# Patient Record
Sex: Female | Born: 1970 | Race: White | Hispanic: No | Marital: Married | Smoking: Never smoker
Health system: Southern US, Community
[De-identification: ages and names within clinical notes are randomized; demographics above are authoritative.]

## PROBLEM LIST (undated history)

## (undated) DIAGNOSIS — F419 Anxiety disorder, unspecified: Secondary | ICD-10-CM

## (undated) DIAGNOSIS — Z8489 Family history of other specified conditions: Secondary | ICD-10-CM

## (undated) DIAGNOSIS — R030 Elevated blood-pressure reading, without diagnosis of hypertension: Secondary | ICD-10-CM

## (undated) DIAGNOSIS — R42 Dizziness and giddiness: Secondary | ICD-10-CM

## (undated) DIAGNOSIS — K219 Gastro-esophageal reflux disease without esophagitis: Secondary | ICD-10-CM

## (undated) DIAGNOSIS — D259 Leiomyoma of uterus, unspecified: Secondary | ICD-10-CM

## (undated) HISTORY — PX: BREAST BIOPSY: SHX20

## (undated) HISTORY — PX: REDUCTION MAMMAPLASTY: SUR839

---

## 2011-03-31 ENCOUNTER — Other Ambulatory Visit: Payer: Self-pay | Admitting: Obstetrics and Gynecology

## 2011-03-31 DIAGNOSIS — Z803 Family history of malignant neoplasm of breast: Secondary | ICD-10-CM

## 2011-04-18 ENCOUNTER — Other Ambulatory Visit: Payer: Self-pay

## 2011-06-23 ENCOUNTER — Encounter: Payer: Self-pay | Admitting: Internal Medicine

## 2011-06-23 ENCOUNTER — Ambulatory Visit (INDEPENDENT_AMBULATORY_CARE_PROVIDER_SITE_OTHER): Payer: PRIVATE HEALTH INSURANCE | Admitting: Internal Medicine

## 2011-06-23 VITALS — BP 108/66 | HR 70 | Temp 98.2°F | Ht 66.0 in | Wt 157.0 lb

## 2011-06-23 DIAGNOSIS — J40 Bronchitis, not specified as acute or chronic: Secondary | ICD-10-CM

## 2011-06-23 MED ORDER — LEVOFLOXACIN 750 MG PO TABS: 750.0000 mg | ORAL_TABLET | Freq: Every day | ORAL | Status: AC

## 2011-06-23 MED ORDER — BENZONATATE 200 MG PO CAPS: 200.0000 mg | ORAL_CAPSULE | Freq: Three times a day (TID) | ORAL | Status: AC | PRN

## 2011-06-23 MED ORDER — GUAIFENESIN-CODEINE 100-10 MG/5ML PO SYRP: 5.0000 mL | ORAL_SOLUTION | Freq: Two times a day (BID) | ORAL | Status: AC | PRN

## 2011-06-23 NOTE — Patient Instructions (Signed)
[email or web address]  

## 2011-06-23 NOTE — Progress Notes (Signed)
Subjective:    Patient ID: Jennifer Wiley, female    DOB: 1970/12/03, 40 y.o.   MRN: [id number]  HPI 40 year old female presents to establish care. Her primary concern today is approximately a one-month history of cough. She notes that she was sick in October with cough and congestion. She attributed this to viral infection. The cough never completely resolved. Over the last week she has had some episodes of fever, chills, and cough with a metallic taste in her mouth which she thought might be blood. The cough has been nonproductive. She has also had some tightness across her chest. She reports that today she is feeling somewhat better. She has been using over-the-counter cough and cold preparations with minimal improvement in her symptoms.   Review of Systems  Constitutional: Positive for fever and chills. Negative for appetite change, fatigue and unexpected weight change.  HENT: Negative for ear pain, congestion, sore throat, trouble swallowing, neck pain, voice change and sinus pressure.   Eyes: Negative for visual disturbance.  Respiratory: Positive for cough and chest tightness. Negative for shortness of breath, wheezing and stridor.   Cardiovascular: Negative for chest pain, palpitations and leg swelling.  Gastrointestinal: Negative for nausea, vomiting, abdominal pain, diarrhea, constipation, blood in stool, abdominal distention and anal bleeding.  Genitourinary: Negative for dysuria and flank pain.  Musculoskeletal: Negative for myalgias, arthralgias and gait problem.  Skin: Negative for color change and rash.  Neurological: Negative for dizziness and headaches.  Hematological: Negative for adenopathy. Does not bruise/bleed easily.  Psychiatric/Behavioral: Negative for suicidal ideas, sleep disturbance and dysphoric mood. The patient is not nervous/anxious.    BP 108/66  Pulse 70  Temp(Src) 98.2 F (36.8 C) (Oral)  Ht 5\' 6"  (1.676 m)  Wt 157 lb (71.215 kg)  BMI 25.34 kg/m2   SpO2 98%  LMP 06/05/2011     Objective:   Physical Exam  Constitutional: She is oriented to person, place, and time. She appears well-developed and well-nourished. No distress.  HENT:  Head: Normocephalic and atraumatic.  Right Ear: External ear normal.  Left Ear: External ear normal.  Nose: Nose normal.  Mouth/Throat: Oropharynx is clear and moist. No oropharyngeal exudate.  Eyes: Conjunctivae are normal. Pupils are equal, round, and reactive to light. Right eye exhibits no discharge. Left eye exhibits no discharge. No scleral icterus.  Neck: Normal range of motion. Neck supple. No tracheal deviation present. No thyromegaly present.  Cardiovascular: Normal rate, regular rhythm, normal heart sounds and intact distal pulses.  Exam reveals no gallop and no friction rub.   No murmur heard. Pulmonary/Chest: Effort normal. No accessory muscle usage. Not tachypneic. No respiratory distress. She has decreased breath sounds in the right lower field. She has no wheezes. She has rhonchi in the right lower field. She has no rales. She exhibits no tenderness.  Musculoskeletal: Normal range of motion. She exhibits no edema and no tenderness.  Lymphadenopathy:    She has no cervical adenopathy.  Neurological: She is alert and oriented to person, place, and time. No cranial nerve deficit. She exhibits normal muscle tone. Coordination normal.  Skin: Skin is warm and dry. No rash noted. She is not diaphoretic. No erythema. No pallor.  Psychiatric: She has a normal mood and affect. Her behavior is normal. Judgment and thought content normal.          Assessment & Plan:  1. Bronchitis - Given persistence of cough x1 month, will get CXR. Will start empiric treatment with Levaquin x 7 days. Will use  tessalon during the day for cough and codeine at night. Follow up if symptoms are not improving next 48 hr.  2. Health Maintenance - Pt gets PAP and breast exam with OB/GYN. Will request records including  yearly labs.

## 2011-06-24 ENCOUNTER — Ambulatory Visit (INDEPENDENT_AMBULATORY_CARE_PROVIDER_SITE_OTHER)
Admission: RE | Admit: 2011-06-24 | Discharge: 2011-06-24 | Disposition: A | Discharge status: Home / Self Care | Payer: PRIVATE HEALTH INSURANCE | Source: Ambulatory Visit | Attending: Internal Medicine | Admitting: Internal Medicine

## 2011-06-24 DIAGNOSIS — J40 Bronchitis, not specified as acute or chronic: Secondary | ICD-10-CM

## 2012-06-07 ENCOUNTER — Other Ambulatory Visit: Payer: Self-pay | Admitting: Obstetrics and Gynecology

## 2012-06-07 DIAGNOSIS — N63 Unspecified lump in unspecified breast: Secondary | ICD-10-CM

## 2012-06-15 ENCOUNTER — Other Ambulatory Visit: Payer: PRIVATE HEALTH INSURANCE

## 2012-06-22 ENCOUNTER — Ambulatory Visit
Admission: RE | Admit: 2012-06-22 | Discharge: 2012-06-22 | Disposition: A | Discharge status: Home / Self Care | Payer: PRIVATE HEALTH INSURANCE | Source: Ambulatory Visit | Attending: Obstetrics and Gynecology | Admitting: Obstetrics and Gynecology

## 2012-06-22 DIAGNOSIS — N63 Unspecified lump in unspecified breast: Secondary | ICD-10-CM

## 2012-08-18 ENCOUNTER — Other Ambulatory Visit: Payer: Self-pay

## 2013-05-09 ENCOUNTER — Other Ambulatory Visit: Payer: Self-pay

## 2013-07-22 ENCOUNTER — Other Ambulatory Visit: Payer: Self-pay | Admitting: Obstetrics and Gynecology

## 2013-07-22 DIAGNOSIS — R928 Other abnormal and inconclusive findings on diagnostic imaging of breast: Secondary | ICD-10-CM

## 2013-08-06 ENCOUNTER — Ambulatory Visit
Admission: RE | Admit: 2013-08-06 | Discharge: 2013-08-06 | Disposition: A | Discharge status: Home / Self Care | Payer: No Typology Code available for payment source | Source: Ambulatory Visit | Attending: Obstetrics and Gynecology | Admitting: Obstetrics and Gynecology

## 2013-08-06 DIAGNOSIS — R928 Other abnormal and inconclusive findings on diagnostic imaging of breast: Secondary | ICD-10-CM

## 2013-10-22 ENCOUNTER — Other Ambulatory Visit: Payer: Self-pay | Admitting: Obstetrics and Gynecology

## 2013-10-22 DIAGNOSIS — R922 Inconclusive mammogram: Secondary | ICD-10-CM

## 2013-11-11 ENCOUNTER — Ambulatory Visit
Admission: RE | Admit: 2013-11-11 | Discharge: 2013-11-11 | Disposition: A | Discharge status: Home / Self Care | Payer: No Typology Code available for payment source | Source: Ambulatory Visit | Attending: Obstetrics and Gynecology | Admitting: Obstetrics and Gynecology

## 2013-11-11 DIAGNOSIS — R922 Inconclusive mammogram: Secondary | ICD-10-CM

## 2013-11-11 MED ORDER — GADOBENATE DIMEGLUMINE 529 MG/ML IV SOLN
14.0000 mL | Freq: Once | INTRAVENOUS | Status: AC | PRN
  Administered 2013-11-11: 14 mL via INTRAVENOUS

## 2014-06-23 ENCOUNTER — Ambulatory Visit (INDEPENDENT_AMBULATORY_CARE_PROVIDER_SITE_OTHER): Payer: No Typology Code available for payment source | Admitting: Internal Medicine

## 2014-06-23 ENCOUNTER — Encounter: Payer: Self-pay | Admitting: Internal Medicine

## 2014-06-23 VITALS — BP 103/68 | HR 96 | Temp 98.0°F | Ht 66.0 in | Wt 168.5 lb

## 2014-06-23 DIAGNOSIS — R5383 Other fatigue: Secondary | ICD-10-CM | POA: Insufficient documentation

## 2014-06-23 DIAGNOSIS — R079 Chest pain, unspecified: Secondary | ICD-10-CM

## 2014-06-23 LAB — COMPREHENSIVE METABOLIC PANEL
ALBUMIN: 4.4 g/dL (ref 3.5–5.2)
ALK PHOS: 78 U/L (ref 39–117)
ALT: 19 U/L (ref 0–35)
AST: 24 U/L (ref 0–37)
BILIRUBIN TOTAL: 0.5 mg/dL (ref 0.2–1.2)
BUN: 17 mg/dL (ref 6–23)
CO2: 28 mEq/L (ref 19–32)
Calcium: 9.2 mg/dL (ref 8.4–10.5)
Chloride: 102 mEq/L (ref 96–112)
Creatinine, Ser: 0.7 mg/dL (ref 0.4–1.2)
GFR: 96.25 mL/min (ref >60.00)
Glucose, Bld: 119 mg/dL — ABNORMAL HIGH (ref 70–99)
POTASSIUM: 3.7 meq/L (ref 3.5–5.1)
Sodium: 139 mEq/L (ref 135–145)
Total Protein: 7 g/dL (ref 6.0–8.3)

## 2014-06-23 LAB — LIPID PANEL
CHOL/HDL RATIO: 4
Cholesterol: 221 mg/dL — ABNORMAL HIGH (ref 0–200)
HDL: 62.8 mg/dL (ref >39.00)
LDL CALC: 125 mg/dL — AB (ref 0–99)
NonHDL: 158.2
TRIGLYCERIDES: 164 mg/dL — AB (ref 0.0–149.0)
VLDL: 32.8 mg/dL (ref 0.0–40.0)

## 2014-06-23 LAB — CBC WITH DIFFERENTIAL/PLATELET
BASOS ABS: 0 10*3/uL (ref 0.0–0.1)
Basophils Relative: 0.4 % (ref 0.0–3.0)
EOS PCT: 0.9 % (ref 0.0–5.0)
Eosinophils Absolute: 0.1 10*3/uL (ref 0.0–0.7)
HEMATOCRIT: 39.9 % (ref 36.0–46.0)
HEMOGLOBIN: 13.2 g/dL (ref 12.0–15.0)
LYMPHS ABS: 2.1 10*3/uL (ref 0.7–4.0)
LYMPHS PCT: 28.1 % (ref 12.0–46.0)
MCHC: 33 g/dL (ref 30.0–36.0)
MCV: 92.6 fl (ref 78.0–100.0)
MONOS PCT: 6.3 % (ref 3.0–12.0)
Monocytes Absolute: 0.5 10*3/uL (ref 0.1–1.0)
NEUTROS ABS: 4.7 10*3/uL (ref 1.4–7.7)
Neutrophils Relative %: 64.3 % (ref 43.0–77.0)
Platelets: 229 10*3/uL (ref 150.0–400.0)
RBC: 4.31 Mil/uL (ref 3.87–5.11)
RDW: 13.3 % (ref 11.5–15.5)
WBC: 7.3 10*3/uL (ref 4.0–10.5)

## 2014-06-23 LAB — C-REACTIVE PROTEIN: CRP: 0.6 mg/dL (ref 0.5–20.0)

## 2014-06-23 LAB — SEDIMENTATION RATE: SED RATE: 10 mm/h (ref 0–22)

## 2014-06-23 LAB — HEMOGLOBIN A1C: Hgb A1c MFr Bld: 5.8 % (ref 4.6–6.5)

## 2014-06-23 NOTE — Assessment & Plan Note (Addendum)
Intermittent symptoms of left sided chest pain at rest. Atypical for CAD. Exam is normal. EKG today normal. Labs today. Follow up in 1 week.

## 2014-06-23 NOTE — Assessment & Plan Note (Addendum)
6 month h/o generalized fatigue. Symptoms suggestive of thyroid dysfunction given cold intolerance, dry skin and hair. Exam normal. Will check CBC, ferritin, CMP, thyroid function. Follow up next week.

## 2014-06-23 NOTE — Progress Notes (Signed)
Subjective:    Patient ID: Jennifer Wiley, female    DOB: 07-01-1971, 43 y.o.   MRN: [id number]  HPI 43YO female presents for acute visit.  Last 6 months, notes weight gain and swelling in ankles and fingers and face, worse in morning. Feels tired. Also notes some depressed mood. Lives with husband and daughter, 10YO. Not taking any supplements. Eats a regular diet. Used to exercise daily, now 3 times per week. Feels more fatigued during exercise. Sleeping well. No snoring, apnea. Feels cold when others are comfortable. Notes dry skin and dry hair. Has regular periods, but now at shorter intervals. No heavy menstrual bleeding. No change in bowel habits.  Also occasionally has left sided chest pain, described as tightness, that radiates to arm. Sometimes wakes her from sleep. Typically occurs once per week. No pain with exertion. No dyspnea or diaphoresis with chest pain.   Multiple relatives with thyroid dysfunction.   Wt Readings from Last 3 Encounters:  06/23/14 168 lb 8 oz (76.431 kg)  06/23/11 157 lb (71.215 kg)      Past medical, surgical, family and social history per today's encounter.  Review of Systems  Constitutional: Positive for fatigue. Negative for fever, chills, appetite change and unexpected weight change.  Eyes: Negative for visual disturbance.  Respiratory: Negative for shortness of breath.   Cardiovascular: Positive for chest pain. Negative for leg swelling.  Gastrointestinal: Negative for nausea, vomiting, abdominal pain, diarrhea and constipation.  Endocrine: Positive for cold intolerance.  Genitourinary: Negative for menstrual problem.  Musculoskeletal: Negative for myalgias and arthralgias.  Skin: Negative for color change and rash.  Hematological: Negative for adenopathy. Does not bruise/bleed easily.  Psychiatric/Behavioral: Positive for dysphoric mood. Negative for suicidal ideas and sleep disturbance. The patient is not nervous/anxious.         Objective:    BP 103/68 mmHg  Pulse 96  Temp(Src) 98 F (36.7 C) (Oral)  Ht 5' 6"  (1.676 m)  Wt 168 lb 8 oz (76.431 kg)  BMI 27.21 kg/m2  SpO2 99%  LMP 06/11/2014 Physical Exam  Constitutional: She is oriented to person, place, and time. She appears well-developed and well-nourished. No distress.  HENT:  Head: Normocephalic and atraumatic.  Right Ear: External ear normal.  Left Ear: External ear normal.  Nose: Nose normal.  Mouth/Throat: Oropharynx is clear and moist. No oropharyngeal exudate.  Eyes: Conjunctivae are normal. Pupils are equal, round, and reactive to light. Right eye exhibits no discharge. Left eye exhibits no discharge. No scleral icterus.  Neck: Normal range of motion. Neck supple. No tracheal deviation present. No thyromegaly present.  Cardiovascular: Normal rate, regular rhythm, normal heart sounds and intact distal pulses.  Exam reveals no gallop and no friction rub.   No murmur heard. Pulmonary/Chest: Effort normal and breath sounds normal. No accessory muscle usage. No tachypnea. No respiratory distress. She has no decreased breath sounds. She has no wheezes. She has no rhonchi. She has no rales. She exhibits no tenderness.  Musculoskeletal: Normal range of motion. She exhibits no edema or tenderness.  Lymphadenopathy:    She has no cervical adenopathy.  Neurological: She is alert and oriented to person, place, and time. No cranial nerve deficit. She exhibits normal muscle tone. Coordination normal.  Skin: Skin is warm and dry. No rash noted. She is not diaphoretic. No erythema. No pallor.  Psychiatric: She has a normal mood and affect. Her behavior is normal. Judgment and thought content normal.  Assessment & Plan:   Problem List Items Addressed This Visit      Unprioritized   Other fatigue - Primary    6 month h/o generalized fatigue. Symptoms suggestive of thyroid dysfunction given cold intolerance, dry skin and hair. Exam normal.  Will check CBC, ferritin, CMP, thyroid function. Follow up next week.    Relevant Orders      CBC with Differential      Comprehensive metabolic panel      Lipid panel      Hemoglobin A1c      TSH      Microalbumin / creatinine urine ratio      Vit D  25 hydroxy (rtn osteoporosis monitoring)      T4, free      B12      Ferritin      ANA      Rheumatoid Factor      Sed Rate (ESR)      C-reactive protein   Pain in the chest    Intermittent symptoms of left sided chest pain at rest. Atypical for CAD. Exam is normal. EKG today normal. Labs today. Follow up in 1 week.    Relevant Orders      EKG 12-Lead (Completed)       Return in about 1 week (around 06/30/2014) for Recheck.

## 2014-06-23 NOTE — Progress Notes (Signed)
Pre visit review using our clinic review tool, if applicable. No additional management support is needed unless otherwise documented below in the visit note. 

## 2014-06-23 NOTE — Patient Instructions (Addendum)
We will check labs today and call you with results as soon as they are in.  Follow up in 1 week.

## 2014-06-24 LAB — RHEUMATOID FACTOR

## 2014-06-24 LAB — TSH: TSH: 1.05 u[IU]/mL (ref 0.35–4.50)

## 2014-06-24 LAB — T4, FREE: Free T4: 0.69 ng/dL (ref 0.60–1.60)

## 2014-06-24 LAB — VITAMIN D 25 HYDROXY (VIT D DEFICIENCY, FRACTURES): VITD: 46.38 ng/mL (ref 30.00–100.00)

## 2014-06-24 LAB — ANA: ANA: NEGATIVE

## 2014-06-24 LAB — VITAMIN B12: Vitamin B-12: 361 pg/mL (ref 211–911)

## 2014-06-24 LAB — FERRITIN: FERRITIN: 11.3 ng/mL (ref 10.0–291.0)

## 2014-06-30 ENCOUNTER — Encounter: Payer: Self-pay | Admitting: Internal Medicine

## 2014-06-30 ENCOUNTER — Ambulatory Visit (INDEPENDENT_AMBULATORY_CARE_PROVIDER_SITE_OTHER): Payer: No Typology Code available for payment source | Admitting: Internal Medicine

## 2014-06-30 VITALS — BP 106/68 | HR 86 | Temp 98.4°F | Ht 66.0 in | Wt 168.0 lb

## 2014-06-30 DIAGNOSIS — J302 Other seasonal allergic rhinitis: Secondary | ICD-10-CM | POA: Insufficient documentation

## 2014-06-30 DIAGNOSIS — F4323 Adjustment disorder with mixed anxiety and depressed mood: Secondary | ICD-10-CM

## 2014-06-30 DIAGNOSIS — R5383 Other fatigue: Secondary | ICD-10-CM

## 2014-06-30 DIAGNOSIS — F411 Generalized anxiety disorder: Secondary | ICD-10-CM | POA: Insufficient documentation

## 2014-06-30 MED ORDER — BUPROPION HCL ER (XL) 150 MG PO TB24: 150.0000 mg | ORAL_TABLET | Freq: Every day | ORAL | Status: DC

## 2014-06-30 NOTE — Progress Notes (Signed)
Subjective:    Patient ID: Derryl HarborCynthia Tatem, female    DOB: 07-29-1970, 43 y.o.   MRN: [id number]  HPI 43YO female presents for follow up.  Recent evaluation for fatigue including labs remarkable for low ferritin consistent with iron deficiency but not anemia.  Pt notes heavy menses, particularly first 1-2 days of menses. Also notes more severe pre-menstrual symptoms over last months. Notes family history of anxiety and depression. Feels that her own anxiety and symptoms of intermittent depressed mood have worsened over recent months. Has never been on anything for this. Trying to follow healthy diet and exercise. Wt Readings from Last 3 Encounters:  06/30/14 168 lb (76.204 kg)  06/23/14 168 lb 8 oz (76.431 kg)  06/23/11 157 lb (71.215 kg)      Past medical, surgical, family and social history per today's encounter.  Review of Systems  Constitutional: Positive for fatigue. Negative for fever, chills, appetite change and unexpected weight change.  Eyes: Negative for visual disturbance.  Respiratory: Negative for shortness of breath.   Cardiovascular: Negative for chest pain and leg swelling.  Gastrointestinal: Negative for nausea, vomiting, abdominal pain, diarrhea and constipation.  Genitourinary: Positive for menstrual problem.  Skin: Negative for color change and rash.  Hematological: Negative for adenopathy. Does not bruise/bleed easily.  Psychiatric/Behavioral: Positive for dysphoric mood. Negative for sleep disturbance. The patient is nervous/anxious.        Objective:    BP 106/68 mmHg  Pulse 86  Temp(Src) 98.4 F (36.9 C) (Oral)  Ht 5\' 6"  (1.676 m)  Wt 168 lb (76.204 kg)  BMI 27.13 kg/m2  SpO2 99%  LMP 06/11/2014 Physical Exam  Constitutional: She is oriented to person, place, and time. She appears well-developed and well-nourished. No distress.  HENT:  Head: Normocephalic and atraumatic.  Right Ear: External ear normal.  Left Ear: External ear normal.    Nose: Nose normal.  Mouth/Throat: Oropharynx is clear and moist. No oropharyngeal exudate.  Eyes: Conjunctivae are normal. Pupils are equal, round, and reactive to light. Right eye exhibits no discharge. Left eye exhibits no discharge. No scleral icterus.  Neck: Normal range of motion. Neck supple. No tracheal deviation present. No thyromegaly present.  Cardiovascular: Normal rate, regular rhythm, normal heart sounds and intact distal pulses.  Exam reveals no gallop and no friction rub.   No murmur heard. Pulmonary/Chest: Effort normal and breath sounds normal. No accessory muscle usage. No tachypnea. No respiratory distress. She has no decreased breath sounds. She has no wheezes. She has no rhonchi. She has no rales. She exhibits no tenderness.  Musculoskeletal: Normal range of motion. She exhibits no edema or tenderness.  Lymphadenopathy:    She has no cervical adenopathy.  Neurological: She is alert and oriented to person, place, and time. No cranial nerve deficit. She exhibits normal muscle tone. Coordination normal.  Skin: Skin is warm and dry. No rash noted. She is not diaphoretic. No erythema. No pallor.  Psychiatric: She has a normal mood and affect. Her speech is normal and behavior is normal. Judgment and thought content normal. She expresses no suicidal ideation.          Assessment & Plan:   Problem List Items Addressed This Visit      Unprioritized   Adjustment disorder with mixed anxiety and depressed mood - Primary    Symptoms consistent with mild anxiety/depression likely exacerbated by perimenopausal hormonal changes. Will start Wellbutrin. Discussed risks and benefits of this medication. Follow up in 2-4 weeks by email  and in 3 months.    Relevant Medications      buPROPion (WELLBUTRIN XL) 24 hr tablet   Other fatigue    Lab evaluation for fatigue remarkable only for iron deficiency likely from menstrual blood loss. She has started an iron supplement. Will recheck  CBC and ferritin in 3 months. Also discussed potential evaluation by GYN for Novasure.    Seasonal allergies    Symptoms of chronic seasonal allergies as well as possible allergy to dust, mold and pet dander. Will set up evaluation with ENT for allergy testing.    Relevant Orders      Ambulatory referral to ENT       Return in about 3 months (around 09/29/2014) for Recheck.

## 2014-06-30 NOTE — Assessment & Plan Note (Signed)
Lab evaluation for fatigue remarkable only for iron deficiency likely from menstrual blood loss. She has started an iron supplement. Will recheck CBC and ferritin in 3 months. Also discussed potential evaluation by GYN for Novasure.

## 2014-06-30 NOTE — Progress Notes (Signed)
Pre visit review using our clinic review tool, if applicable. No additional management support is needed unless otherwise documented below in the visit note. 

## 2014-06-30 NOTE — Assessment & Plan Note (Signed)
Symptoms consistent with mild anxiety/depression likely exacerbated by perimenopausal hormonal changes. Will start Wellbutrin. Discussed risks and benefits of this medication. Follow up in 2-4 weeks by email and in 3 months.

## 2014-06-30 NOTE — Assessment & Plan Note (Signed)
Symptoms of chronic seasonal allergies as well as possible allergy to dust, mold and pet dander. Will set up evaluation with ENT for allergy testing.

## 2014-06-30 NOTE — Patient Instructions (Addendum)
Start Wellbutrin 150mg  daily in the morning.  Follow up with labs in 3 months.

## 2014-07-15 ENCOUNTER — Other Ambulatory Visit: Payer: Self-pay | Admitting: Internal Medicine

## 2014-07-15 DIAGNOSIS — F4323 Adjustment disorder with mixed anxiety and depressed mood: Secondary | ICD-10-CM

## 2014-07-15 MED ORDER — BUPROPION HCL ER (XL) 300 MG PO TB24: 300.0000 mg | ORAL_TABLET | Freq: Every day | ORAL | Status: DC

## 2014-12-04 ENCOUNTER — Encounter: Payer: Self-pay | Admitting: Internal Medicine

## 2014-12-04 ENCOUNTER — Ambulatory Visit (INDEPENDENT_AMBULATORY_CARE_PROVIDER_SITE_OTHER): Payer: No Typology Code available for payment source | Admitting: Internal Medicine

## 2014-12-04 VITALS — BP 121/81 | HR 89 | Temp 98.1°F | Ht 66.0 in | Wt 161.1 lb

## 2014-12-04 DIAGNOSIS — F4323 Adjustment disorder with mixed anxiety and depressed mood: Secondary | ICD-10-CM

## 2014-12-04 MED ORDER — BUPROPION HCL ER (XL) 150 MG PO TB24: 150.0000 mg | ORAL_TABLET | Freq: Every day | ORAL | Status: DC

## 2014-12-04 NOTE — Assessment & Plan Note (Signed)
Symptoms improved with use of Wellbutrin. Will continue. Follow up 1 year and prn.

## 2014-12-04 NOTE — Progress Notes (Signed)
Pre visit review using our clinic review tool, if applicable. No additional management support is needed unless otherwise documented below in the visit note. 

## 2014-12-04 NOTE — Patient Instructions (Signed)
Continue current medications. 

## 2014-12-04 NOTE — Progress Notes (Signed)
   Subjective:    Patient ID: Jennifer Wiley, female    DOB: 1971-05-09, 44 y.o.   MRN: [id number]  HPI  44YO female presents for follow up.  Seen 06/2014 for fatigue and anxiety. Started on Wellbutrin. Notes improvement in energy level and anxiety with this medication. Briefly tried to increase to 300mg  daily but did not tolerate this and had palpitations. Feeling better on 150mg  dosing. No new concerns today.   Wt Readings from Last 3 Encounters:  12/04/14 161 lb 2 oz (73.086 kg)  06/30/14 168 lb (76.204 kg)  06/23/14 168 lb 8 oz (76.431 kg)    Past medical, surgical, family and social history per today's encounter.  Review of Systems  Constitutional: Negative for fever, chills, appetite change, fatigue and unexpected weight change.  Eyes: Negative for visual disturbance.  Respiratory: Negative for shortness of breath.   Cardiovascular: Negative for chest pain, palpitations and leg swelling.  Gastrointestinal: Negative for abdominal pain.  Skin: Negative for color change and rash.  Hematological: Negative for adenopathy. Does not bruise/bleed easily.  Psychiatric/Behavioral: Negative for suicidal ideas, sleep disturbance and dysphoric mood. The patient is not nervous/anxious.        Objective:    BP 121/81 mmHg  Pulse 89  Temp(Src) 98.1 F (36.7 C) (Oral)  Ht 5\' 6"  (1.676 m)  Wt 161 lb 2 oz (73.086 kg)  BMI 26.02 kg/m2  SpO2 99%  LMP 11/24/2014 Physical Exam  Constitutional: She is oriented to person, place, and time. She appears well-developed and well-nourished. No distress.  HENT:  Head: Normocephalic and atraumatic.  Right Ear: External ear normal.  Left Ear: External ear normal.  Nose: Nose normal.  Mouth/Throat: Oropharynx is clear and moist. No oropharyngeal exudate.  Eyes: Conjunctivae are normal. Pupils are equal, round, and reactive to light. Right eye exhibits no discharge. Left eye exhibits no discharge. No scleral icterus.  Neck: Normal range of  motion. Neck supple. No tracheal deviation present. No thyromegaly present.  Cardiovascular: Normal rate, regular rhythm, normal heart sounds and intact distal pulses.  Exam reveals no gallop and no friction rub.   No murmur heard. Pulmonary/Chest: Effort normal and breath sounds normal. No respiratory distress. She has no wheezes. She has no rales. She exhibits no tenderness.  Musculoskeletal: Normal range of motion. She exhibits no edema or tenderness.  Lymphadenopathy:    She has no cervical adenopathy.  Neurological: She is alert and oriented to person, place, and time. No cranial nerve deficit. She exhibits normal muscle tone. Coordination normal.  Skin: Skin is warm and dry. No rash noted. She is not diaphoretic. No erythema. No pallor.  Psychiatric: She has a normal mood and affect. Her behavior is normal. Judgment and thought content normal.          Assessment & Plan:   Problem List Items Addressed This Visit      Unprioritized   Adjustment disorder with mixed anxiety and depressed mood - Primary    Symptoms improved with use of Wellbutrin. Will continue. Follow up 1 year and prn.      Relevant Medications   buPROPion (WELLBUTRIN XL) 150 MG 24 hr tablet       Return in about 1 year (around 12/04/2015) for Recheck.

## 2014-12-16 ENCOUNTER — Other Ambulatory Visit: Payer: Self-pay | Admitting: Obstetrics and Gynecology

## 2014-12-16 DIAGNOSIS — R928 Other abnormal and inconclusive findings on diagnostic imaging of breast: Secondary | ICD-10-CM

## 2015-01-07 ENCOUNTER — Ambulatory Visit
Admission: RE | Admit: 2015-01-07 | Discharge: 2015-01-07 | Disposition: A | Discharge status: Home / Self Care | Payer: No Typology Code available for payment source | Source: Ambulatory Visit | Attending: Obstetrics and Gynecology | Admitting: Obstetrics and Gynecology

## 2015-01-07 DIAGNOSIS — R928 Other abnormal and inconclusive findings on diagnostic imaging of breast: Secondary | ICD-10-CM

## 2015-01-10 ENCOUNTER — Other Ambulatory Visit: Payer: Self-pay | Admitting: Internal Medicine

## 2015-01-12 NOTE — Telephone Encounter (Signed)
Last OV and refill 6.2.16.  Please advise refill

## 2015-04-02 ENCOUNTER — Other Ambulatory Visit: Payer: Self-pay | Admitting: Internal Medicine

## 2015-04-15 ENCOUNTER — Other Ambulatory Visit: Payer: Self-pay | Admitting: Internal Medicine

## 2015-06-10 ENCOUNTER — Other Ambulatory Visit: Payer: Self-pay | Admitting: Obstetrics and Gynecology

## 2015-06-10 DIAGNOSIS — N6489 Other specified disorders of breast: Secondary | ICD-10-CM

## 2015-06-16 ENCOUNTER — Ambulatory Visit
Admission: RE | Admit: 2015-06-16 | Discharge: 2015-06-16 | Disposition: A | Discharge status: Home / Self Care | Payer: No Typology Code available for payment source | Source: Ambulatory Visit | Attending: Obstetrics and Gynecology | Admitting: Obstetrics and Gynecology

## 2015-06-16 ENCOUNTER — Other Ambulatory Visit: Payer: Self-pay | Admitting: Obstetrics and Gynecology

## 2015-06-16 DIAGNOSIS — N6489 Other specified disorders of breast: Secondary | ICD-10-CM

## 2015-06-17 ENCOUNTER — Telehealth: Payer: Self-pay | Admitting: Internal Medicine

## 2015-06-17 NOTE — Telephone Encounter (Signed)
Patient is having a breast biopsy. Can you please call in Alprazolam 0.5mg  po x 1 taken 1hr before procedure, then repeat 0.5mg  po x 1 at time of procedure. Fine to dispense 4tabs in case they need to do another biopsy. Thanks!

## 2015-06-19 NOTE — Telephone Encounter (Signed)
Notified paitent via call.

## 2015-06-19 NOTE — Telephone Encounter (Signed)
[facility]

## 2015-06-23 ENCOUNTER — Ambulatory Visit
Admission: RE | Admit: 2015-06-23 | Discharge: 2015-06-23 | Disposition: A | Discharge status: Home / Self Care | Payer: No Typology Code available for payment source | Source: Ambulatory Visit | Attending: Obstetrics and Gynecology | Admitting: Obstetrics and Gynecology

## 2015-06-23 ENCOUNTER — Other Ambulatory Visit: Payer: Self-pay | Admitting: Obstetrics and Gynecology

## 2015-06-23 DIAGNOSIS — N6489 Other specified disorders of breast: Secondary | ICD-10-CM

## 2015-06-30 ENCOUNTER — Other Ambulatory Visit: Payer: No Typology Code available for payment source

## 2015-11-10 ENCOUNTER — Telehealth: Payer: Self-pay | Admitting: Internal Medicine

## 2015-11-10 ENCOUNTER — Telehealth: Payer: Self-pay | Admitting: *Deleted

## 2015-11-10 DIAGNOSIS — F4323 Adjustment disorder with mixed anxiety and depressed mood: Secondary | ICD-10-CM

## 2015-11-10 MED ORDER — BUPROPION HCL ER (XL) 150 MG PO TB24: 150.0000 mg | ORAL_TABLET | Freq: Every day | ORAL | Status: DC

## 2015-11-10 NOTE — Telephone Encounter (Signed)
Pt needs this sent to [facility] on [address]...not [facility].. Thanks

## 2015-11-10 NOTE — Telephone Encounter (Signed)
Patient requested a medication refill for bupropion  Pharmacy [facility] [address]

## 2015-11-10 NOTE — Telephone Encounter (Signed)
Resent to the correct pharmacy. thanks

## 2015-11-10 NOTE — Addendum Note (Signed)
Addended by: Acey LavOMAN, Dimitria Ketchum M on: 11/10/2015 04:11 PM   Modules accepted: Orders

## 2015-11-10 NOTE — Telephone Encounter (Signed)
Refilled. Thanks!

## 2016-01-11 ENCOUNTER — Ambulatory Visit: Payer: No Typology Code available for payment source | Admitting: Internal Medicine

## 2016-06-30 ENCOUNTER — Other Ambulatory Visit: Payer: Self-pay

## 2016-06-30 DIAGNOSIS — F4323 Adjustment disorder with mixed anxiety and depressed mood: Secondary | ICD-10-CM

## 2016-06-30 MED ORDER — BUPROPION HCL ER (XL) 150 MG PO TB24: 150.0000 mg | ORAL_TABLET | Freq: Every day | ORAL | 0 refills | Status: DC

## 2016-06-30 NOTE — Telephone Encounter (Signed)
No office visit scheduled  Last office visit 12/22/14 Overdue appointment

## 2016-08-12 ENCOUNTER — Other Ambulatory Visit: Payer: Self-pay | Admitting: Obstetrics and Gynecology

## 2016-08-12 DIAGNOSIS — N6489 Other specified disorders of breast: Secondary | ICD-10-CM

## 2016-08-12 DIAGNOSIS — N63 Unspecified lump in unspecified breast: Secondary | ICD-10-CM

## 2016-08-18 ENCOUNTER — Ambulatory Visit
Admission: RE | Admit: 2016-08-18 | Discharge: 2016-08-18 | Disposition: A | Discharge status: Home / Self Care | Payer: Managed Care, Other (non HMO) | Source: Ambulatory Visit | Attending: Obstetrics and Gynecology | Admitting: Obstetrics and Gynecology

## 2016-08-18 DIAGNOSIS — N6489 Other specified disorders of breast: Secondary | ICD-10-CM

## 2017-01-23 ENCOUNTER — Other Ambulatory Visit: Payer: Self-pay | Admitting: Family Medicine

## 2017-01-23 DIAGNOSIS — F4323 Adjustment disorder with mixed anxiety and depressed mood: Secondary | ICD-10-CM

## 2017-01-23 NOTE — Telephone Encounter (Signed)
lov 12/04/14 Dr Dan HumphreysWalker No office visit scheduled Last refilled 12/24/16

## 2017-10-16 ENCOUNTER — Other Ambulatory Visit: Payer: Self-pay | Admitting: Family Medicine

## 2017-10-16 DIAGNOSIS — R1011 Right upper quadrant pain: Secondary | ICD-10-CM

## 2017-10-31 ENCOUNTER — Ambulatory Visit
Admission: RE | Admit: 2017-10-31 | Discharge: 2017-10-31 | Disposition: A | Discharge status: Home / Self Care | Payer: 59 | Source: Ambulatory Visit | Attending: Family Medicine | Admitting: Family Medicine

## 2017-10-31 DIAGNOSIS — R1011 Right upper quadrant pain: Secondary | ICD-10-CM | POA: Diagnosis present

## 2018-07-04 DIAGNOSIS — Z8619 Personal history of other infectious and parasitic diseases: Secondary | ICD-10-CM

## 2018-07-04 HISTORY — DX: Personal history of other infectious and parasitic diseases: Z86.19

## 2018-12-05 ENCOUNTER — Other Ambulatory Visit: Payer: Self-pay | Admitting: Obstetrics and Gynecology

## 2018-12-05 DIAGNOSIS — N644 Mastodynia: Secondary | ICD-10-CM

## 2018-12-11 ENCOUNTER — Other Ambulatory Visit: Payer: 59

## 2018-12-25 ENCOUNTER — Other Ambulatory Visit: Payer: 59

## 2019-01-29 ENCOUNTER — Other Ambulatory Visit: Payer: Self-pay

## 2019-01-29 ENCOUNTER — Ambulatory Visit
Admission: RE | Admit: 2019-01-29 | Discharge: 2019-01-29 | Disposition: A | Discharge status: Home / Self Care | Payer: 59 | Source: Ambulatory Visit | Attending: Obstetrics and Gynecology | Admitting: Obstetrics and Gynecology

## 2019-01-29 ENCOUNTER — Ambulatory Visit: Payer: 59

## 2019-01-29 DIAGNOSIS — N644 Mastodynia: Secondary | ICD-10-CM

## 2019-04-03 ENCOUNTER — Telehealth: Payer: Self-pay | Admitting: Internal Medicine

## 2019-04-03 NOTE — Telephone Encounter (Signed)
Dr. Derrel Nip has excepted this patient. Lm to call office to set up a virtual appt.

## 2019-04-17 ENCOUNTER — Encounter: Payer: Self-pay | Admitting: Internal Medicine

## 2019-04-17 ENCOUNTER — Ambulatory Visit (INDEPENDENT_AMBULATORY_CARE_PROVIDER_SITE_OTHER): Payer: 59 | Admitting: Internal Medicine

## 2019-04-17 ENCOUNTER — Other Ambulatory Visit: Payer: Self-pay

## 2019-04-17 VITALS — BP 118/80 | Ht 67.0 in | Wt 175.0 lb

## 2019-04-17 DIAGNOSIS — K760 Fatty (change of) liver, not elsewhere classified: Secondary | ICD-10-CM

## 2019-04-17 DIAGNOSIS — B0229 Other postherpetic nervous system involvement: Secondary | ICD-10-CM

## 2019-04-17 DIAGNOSIS — N926 Irregular menstruation, unspecified: Secondary | ICD-10-CM | POA: Diagnosis not present

## 2019-04-17 DIAGNOSIS — F411 Generalized anxiety disorder: Secondary | ICD-10-CM

## 2019-04-17 DIAGNOSIS — R635 Abnormal weight gain: Secondary | ICD-10-CM | POA: Diagnosis not present

## 2019-04-17 MED ORDER — GABAPENTIN 100 MG PO CAPS: 100.0000 mg | ORAL_CAPSULE | Freq: Three times a day (TID) | ORAL | 3 refills | Status: DC | PRN

## 2019-04-17 MED ORDER — VENLAFAXINE HCL ER 37.5 MG PO CP24: 37.5000 mg | ORAL_CAPSULE | Freq: Every day | ORAL | 0 refills | Status: DC

## 2019-04-17 NOTE — Assessment & Plan Note (Signed)
Checking POCT pregnancy thyroid, FSH/LH/testosterone/DHEA to rule out premenopause vs PCOS

## 2019-04-17 NOTE — Assessment & Plan Note (Signed)
Screening or thyroid, diabetes,  Fatty liver suggested by abd ultrasound done last year.

## 2019-04-17 NOTE — Assessment & Plan Note (Signed)
Involving right breast and back.  trial of prn gabapentin

## 2019-04-17 NOTE — Progress Notes (Signed)
Virtual Visit via [email or web address]  This visit type was conducted due to national recommendations for restrictions regarding the COVID-19 pandemic (e.g. social distancing).  This format is felt to be most appropriate for this patient at this time.  All issues noted in this document were discussed and addressed.  No physical exam was performed (except for noted visual exam findings with Video Visits).   I connected with@ on 04/17/19 at  8:30 AM EDT by a video enabled telemedicine applicationand verified that I am speaking with the correct person using two identifiers. Location patient: home Location provider: work or home office Persons participating in the virtual visit: patient, provider  I discussed the limitations, risks, security and privacy concerns of performing an evaluation and management service by telephone and the availability of in person appointments. I also discussed with the patient that there may be a patient responsible charge related to this service. The patient expressed understanding and agreed to proceed.   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  Periods have become irregular has skipped one every 6 moths for the last year.    Last PAP unknown but [facility] Dr.  Orvan Seen   History of shingles involving right breast and back  In July.  Diagnostic mammogram done prior to diagnosis because pain preceded rash. Mammogram normal.  Maternal history of BRCA   Increased stressors (pandemic, election) aggravating symptoms.  Took wellbutrin in the past for anxiety  Helped a lot but started having nocturnal neurologic side effects so stopped it.      ROS: See pertinent positives and negatives per HPI.  No past medical history on file.  Past Surgical History:  Procedure Laterality Date  .  CESAREAN SECTION  2005    Family History  Problem Relation Age of Onset  . Breast cancer Mother   . Cancer Mother        breast  . Heart disease Father 42  . Heart attack Father   . Hyperlipidemia Father   . Depression Brother   . Lung cancer Maternal Aunt   . Lung cancer Maternal Grandfather   . Colon cancer Neg Hx     SOCIAL HX:  reports that she has never smoked. She has never used smokeless tobacco. She reports current alcohol use. She reports that she does not use drugs.   Current Outpatient Medications:  .  gabapentin (NEURONTIN) 100 MG capsule, Take 1 capsule (100 mg total) by mouth 3 (three) times daily as needed. For shingles pain, Disp: 90 capsule, Rfl: 3 .  venlafaxine XR (EFFEXOR XR) 37.5 MG 24 hr capsule, Take 1 capsule (37.5 mg total) by mouth daily with breakfast., Disp: 90 capsule, Rfl: 0  EXAM:  VITALS per patient if applicable:  GENERAL: alert, oriented, appears well and in no acute distress  HEENT: atraumatic, conjunttiva clear, no obvious abnormalities on inspection of external nose and ears  NECK: normal movements of the head and neck  LUNGS: on inspection no signs of respiratory distress, breathing rate appears normal, no obvious gross SOB, gasping or wheezing  CV: no obvious cyanosis  MS: moves all visible extremities without noticeable abnormality  PSYCH/NEURO: pleasant and cooperative, no obvious depression or anxiety, speech and thought processing grossly intact  ASSESSMENT AND PLAN:  Discussed the following assessment and plan:  Weight gain - Plan: Thyroid Panel With TSH, Hemoglobin A1c  Menstrual irregularity - Plan: Thyroid Panel With TSH, Follicle stimulating hormone, LH, Testos,Total,Free and SHBG (Female), DHEA-sulfate, CBC with Differential/Platelet, POCT urine pregnancy  Hepatic steatosis - Plan: Comprehensive metabolic panel, Lipid panel  Generalized anxiety disorder  Neuralgia, post-herpetic  Generalized anxiety  disorder Trial of Effexor discussed given concurrent menopausal symptoms.  Starting dose 37.5 mg qhs .  Will add buspar if needed   Follow up two weeks   Neuralgia, post-herpetic Involving right breast and back.  trial of prn gabapentin   Weight gain Screening or thyroid, diabetes,  Fatty liver suggested by abd ultrasound done last year.   Menstrual irregularity Checking POCT pregnancy thyroid, FSH/LH/testosterone/DHEA to rule out premenopause vs PCOS    I discussed the assessment and treatment plan with the patient. The patient was provided an opportunity to ask questions and all were answered. The patient agreed with the plan and demonstrated an understanding of the instructions.   The patient was advised to call back or seek an in-person evaluation if the symptoms worsen or if the condition fails to improve as anticipated.  I provided  45 minutes of non-face-to-face time during this encounter reviewing patient's current problems and post surgeries.  Providing counseling on the above mentioned problems , and coordination  of care .  Crecencio Mc, MD

## 2019-04-17 NOTE — Assessment & Plan Note (Signed)
Trial of Effexor discussed given concurrent menopausal symptoms.  Starting dose 37.5 mg qhs .  Will add buspar if needed   Follow up two weeks

## 2019-04-23 NOTE — Telephone Encounter (Signed)
error 

## 2021-11-01 ENCOUNTER — Other Ambulatory Visit: Payer: Self-pay | Admitting: Obstetrics and Gynecology

## 2021-11-01 DIAGNOSIS — R928 Other abnormal and inconclusive findings on diagnostic imaging of breast: Secondary | ICD-10-CM

## 2021-11-15 ENCOUNTER — Other Ambulatory Visit: Payer: 59

## 2021-11-25 ENCOUNTER — Other Ambulatory Visit: Payer: 59

## 2021-11-30 ENCOUNTER — Other Ambulatory Visit: Payer: Self-pay | Admitting: Obstetrics and Gynecology

## 2021-11-30 ENCOUNTER — Ambulatory Visit
Admission: RE | Admit: 2021-11-30 | Discharge: 2021-11-30 | Disposition: A | Discharge status: Home / Self Care | Payer: 59 | Source: Ambulatory Visit | Attending: Obstetrics and Gynecology | Admitting: Obstetrics and Gynecology

## 2021-11-30 DIAGNOSIS — R928 Other abnormal and inconclusive findings on diagnostic imaging of breast: Secondary | ICD-10-CM

## 2021-11-30 DIAGNOSIS — N6489 Other specified disorders of breast: Secondary | ICD-10-CM

## 2022-01-14 ENCOUNTER — Ambulatory Visit
Admission: RE | Admit: 2022-01-14 | Discharge: 2022-01-14 | Disposition: A | Discharge status: Home / Self Care | Payer: 59 | Source: Ambulatory Visit | Attending: Obstetrics and Gynecology | Admitting: Obstetrics and Gynecology

## 2022-01-14 DIAGNOSIS — N6489 Other specified disorders of breast: Secondary | ICD-10-CM

## 2022-10-06 ENCOUNTER — Encounter (HOSPITAL_BASED_OUTPATIENT_CLINIC_OR_DEPARTMENT_OTHER): Payer: Self-pay | Admitting: Obstetrics and Gynecology

## 2022-10-06 ENCOUNTER — Other Ambulatory Visit: Payer: Self-pay

## 2022-10-06 DIAGNOSIS — Z01818 Encounter for other preprocedural examination: Secondary | ICD-10-CM | POA: Diagnosis present

## 2022-10-06 NOTE — Progress Notes (Signed)
Your procedure is scheduled on Thursday, 10/13/2022.  Report to [facility] AT  5:30 AM.   Call this number if you have problems the morning of surgery  :[phone].   OUR ADDRESS IS [place].  WE ARE LOCATED IN [facility].  PLEASE BRING YOUR INSURANCE CARD AND PHOTO ID DAY OF SURGERY.  ONLY 2 PEOPLE ARE ALLOWED IN  WAITING  ROOM / CURRENTLY NO ONE UNDER AGE 50                                     REMEMBER:  DO NOT EAT FOOD, CANDY GUM OR MINTS  AFTER MIDNIGHT THE NIGHT BEFORE YOUR SURGERY . YOU MAY HAVE CLEAR LIQUIDS FROM MIDNIGHT THE NIGHT BEFORE YOUR SURGERY UNTIL 4:30 AM. NO CLEAR LIQUIDS AFTER  4:30 AM DAY OF SURGERY.  YOU MAY  BRUSH YOUR TEETH MORNING OF SURGERY AND RINSE YOUR MOUTH OUT, NO CHEWING GUM CANDY OR MINTS.     CLEAR LIQUID DIET    Allowed      Water                                                                   Coffee and tea, regular and decaf  (NO cream or milk products of any type, may sweeten)                         Carbonated beverages, regular and diet                                    Sports drinks like Gatorade _____________________________________________________________________     TAKE ONLY THESE MEDICATIONS MORNING OF SURGERY:  Wellbutrin, Gabapentin if needed    UP TO 4 VISITORS  MAY VISIT IN THE EXTENDED RECOVERY ROOM UNTIL 800 PM ONLY.  ONE  VISITOR AGE 84 AND OVER MAY SPEND THE NIGHT AND MUST BE IN EXTENDED RECOVERY ROOM NO LATER THAN 800 PM . YOUR DISCHARGE TIME AFTER YOU SPEND THE NIGHT IS 900 AM THE MORNING AFTER YOUR SURGERY.  YOU MAY PACK A SMALL OVERNIGHT BAG WITH TOILETRIES FOR YOUR OVERNIGHT STAY IF YOU WISH.  YOUR PRESCRIPTION MEDICATIONS WILL BE PROVIDED DURING [place].                                      DO NOT WEAR JEWERLY/  METAL/  PIERCINGS (INCLUDING NO PLASTIC PIERCINGS) DO NOT WEAR LOTIONS, POWDERS, PERFUMES OR NAIL POLISH ON YOUR FINGERNAILS. TOENAIL  POLISH IS OK TO WEAR. DO NOT SHAVE FOR 48 HOURS PRIOR TO DAY OF SURGERY.  CONTACTS, GLASSES, OR DENTURES MAY NOT BE WORN TO SURGERY.  REMEMBER: NO SMOKING, VAPING ,  DRUGS OR ALCOHOL FOR 24 HOURS BEFORE YOUR SURGERY.  [facility] IS NOT RESPONSIBLE  FOR ANY BELONGINGS.                                                                    Marland Kitchen           [facility] - Preparing for Surgery Before surgery, you can play an important role.  Because skin is not sterile, your skin needs to be as free of germs as possible.  You can reduce the number of germs on your skin by washing with CHG (chlorahexidine gluconate) soap before surgery.  CHG is an antiseptic cleaner which kills germs and bonds with the skin to continue killing germs even after washing. Please DO NOT use if you have an allergy to CHG or antibacterial soaps.  If your skin becomes reddened/irritated stop using the CHG and inform your nurse when you arrive at Short Stay. Do not shave (including legs and underarms) for at least 48 hours prior to the first CHG shower.  You may shave your face/neck. Please follow these instructions carefully:  1.  Shower with CHG Soap the night before surgery and the  morning of Surgery.  2.  If you choose to wash your hair, wash your hair first as usual with your  normal  shampoo.  3.  After you shampoo, rinse your hair and body thoroughly to remove the  shampoo.                                        4.  Use CHG as you would any other liquid soap.  You can apply chg directly  to the skin and wash , chg soap provided, night before and morning of your surgery.  5.  Apply the CHG Soap to your body ONLY FROM THE NECK DOWN.   Do not use on face/ open                           Wound or open sores. Avoid contact with eyes, ears mouth and genitals (private parts).                       Wash face,  Genitals (private parts) with your normal soap.             6.  Wash thoroughly, paying  special attention to the area where your surgery  will be performed.  7.  Thoroughly rinse your body with warm water from the neck down.  8.  DO NOT shower/wash with your normal soap after using and rinsing off  the CHG Soap.             9.  Pat yourself dry with a clean towel.            10.  Wear clean pajamas.            11.  Place clean sheets on your bed the night of your first shower and do not  sleep with pets. Day of Surgery : Do not apply any lotions/ powders the morning of surgery.  Please wear clean clothes to the hospital/surgery  center.  IF YOU HAVE ANY SKIN IRRITATION OR PROBLEMS WITH THE SURGICAL SOAP, PLEASE GET A BAR OF GOLD DIAL SOAP AND SHOWER THE NIGHT BEFORE YOUR SURGERY AND THE MORNING OF YOUR SURGERY. PLEASE LET THE NURSE KNOW MORNING OF YOUR SURGERY IF YOU HAD ANY PROBLEMS WITH THE SURGICAL SOAP.   ________________________________________________________________________                                                        QUESTIONS [facility] PRE OP NURSE PHONE [phone].

## 2022-10-06 NOTE — Progress Notes (Signed)
Spoke w/ via phone for pre-op interview---Jennifer Wiley needs dos---- urine pregnancy per anesthesia, surgeon orders pending as of 10/06/22              Wiley results------10/10/22 Wiley appt for cbc, type & screen COVID test -----patient states asymptomatic no test needed Arrive at -------0530 on Thursday, 10/13/2022 NPO after MN NO Solid Food.  Clear liquids from MN until---0430 Med rec completed Medications to take morning of surgery -----Wellbutrin, Gabapentin prn Diabetic medication -----n/a Patient instructed no nail polish to be worn day of surgery Patient instructed to bring photo id and insurance card day of surgery Patient aware to have Driver (ride ) / caregiver    for 24 hours after surgery - husband to stay overnight and leave early the next morning, mother will come and drive home Patient Special Instructions -----Extended / overnight stay instructions given. Pre-Op special Istructions -----Requested orders from Dr. Julien Girt via Epic IB on 10/06/22. Patient verbalized understanding of instructions that were given at this phone interview. Patient denies shortness of breath, chest pain, fever, cough at this phone interview.

## 2022-10-10 ENCOUNTER — Encounter (HOSPITAL_COMMUNITY)
Admission: RE | Admit: 2022-10-10 | Discharge: 2022-10-10 | Disposition: A | Discharge status: Home / Self Care | Payer: 59 | Source: Ambulatory Visit | Attending: Obstetrics and Gynecology | Admitting: Obstetrics and Gynecology

## 2022-10-10 DIAGNOSIS — Z01818 Encounter for other preprocedural examination: Secondary | ICD-10-CM | POA: Diagnosis not present

## 2022-10-10 LAB — CBC
HCT: 42.3 % (ref 36.0–46.0)
Hemoglobin: 14 g/dL (ref 12.0–15.0)
MCH: 31.6 pg (ref 26.0–34.0)
MCHC: 33.1 g/dL (ref 30.0–36.0)
MCV: 95.5 fL (ref 80.0–100.0)
Platelets: 248 10*3/uL (ref 150–400)
RBC: 4.43 MIL/uL (ref 3.87–5.11)
RDW: 12.9 % (ref 11.5–15.5)
WBC: 6.4 10*3/uL (ref 4.0–10.5)
nRBC: 0 % (ref 0.0–0.2)

## 2022-10-11 NOTE — H&P (Signed)
Jennifer Wiley is an 52 y.o. female with large symptomatic uterine fibroids presents for surgical management.      Menstrual History: Patient's last menstrual period was 09/05/2022.    Past Medical History:  Diagnosis Date   Anxiety    Follows w/ Dr. Renaldo Fiddler, GYN.   Borderline hypertension    Per pt on 10/06/22. Pt states her doctor thinks it is being caused by the BCP she is taking.   Family history of adverse reaction to anesthesia    Patient's mother could feel both of her C-sections to some degree.   History of shingles 2020   right breast and back   Uterine fibroid     Past Surgical History:  Procedure Laterality Date   BREAST BIOPSY Left    x 2, both benign   CESAREAN SECTION  07/05/2003    Family History  Problem Relation Age of Onset   Breast cancer Mother    Cancer Mother        breast   Heart disease Father 63   Heart attack Father    Hyperlipidemia Father    Depression Brother    Lung cancer Maternal Aunt    Lung cancer Maternal Grandfather    Colon cancer Neg Hx     Social History:  reports that she has never smoked. She has never used smokeless tobacco. She reports current alcohol use of about 7.0 standard drinks of alcohol per week. She reports that she does not use drugs.  Allergies: No Known Allergies Meds:  lo loestrin, buproprion  Review of Systems negative  Height 5' 6.5" (1.689 m), weight 81.2 kg, last menstrual period 09/05/2022. Physical Exam Gen - NAD CV - RRR Lungs - clear Abd - uterine fibroids.  NT/ND PV - uterus 14-16 wks.  NT. No adnexal mass  PVUS:  Multiple uterine fibroids measuring 1.3-5.4cm.  no adnexal mass or free fluid  Results for orders placed or performed during the hospital encounter of 10/10/22 (from the past 24 hour(s))  CBC     Status: None   Collection Time: 10/10/22  8:55 AM  Result Value Ref Range   WBC 6.4 4.0 - 10.5 K/uL   RBC 4.43 3.87 - 5.11 MIL/uL   Hemoglobin 14.0 12.0 - 15.0 g/dL   HCT 41.6 38.4 -  53.6 %   MCV 95.5 80.0 - 100.0 fL   MCH 31.6 26.0 - 34.0 pg   MCHC 33.1 30.0 - 36.0 g/dL   RDW 46.8 03.2 - 12.2 %   Platelets 248 150 - 400 K/uL   nRBC 0.0 0.0 - 0.2 %  Type and screen  SURGERY CENTER     Status: None   Collection Time: 10/10/22  8:55 AM  Result Value Ref Range   ABO/RH(D) B POS    Antibody Screen NEG    Sample Expiration 10/24/2022,2359    Extend sample reason      NO TRANSFUSIONS OR PREGNANCY IN THE PAST 3 MONTHS Performed at [facility], [address] [place], [place] [address]      Assessment/Plan: Uterine fibroids with symptoms TAH/BS  Zelphia Cairo 10/11/2022, 8:25 AM

## 2022-10-13 ENCOUNTER — Encounter (HOSPITAL_BASED_OUTPATIENT_CLINIC_OR_DEPARTMENT_OTHER): Payer: Self-pay | Admitting: Obstetrics and Gynecology

## 2022-10-13 ENCOUNTER — Encounter (HOSPITAL_BASED_OUTPATIENT_CLINIC_OR_DEPARTMENT_OTHER)
Admission: RE | Disposition: A | Discharge status: Home / Self Care | Payer: Self-pay | Source: Home / Self Care | Attending: Obstetrics and Gynecology

## 2022-10-13 ENCOUNTER — Other Ambulatory Visit: Payer: Self-pay

## 2022-10-13 ENCOUNTER — Ambulatory Visit (HOSPITAL_BASED_OUTPATIENT_CLINIC_OR_DEPARTMENT_OTHER): Payer: 59 | Admitting: Anesthesiology

## 2022-10-13 ENCOUNTER — Observation Stay (HOSPITAL_BASED_OUTPATIENT_CLINIC_OR_DEPARTMENT_OTHER)
Admission: RE | Admit: 2022-10-13 | Discharge: 2022-10-14 | Disposition: A | Discharge status: Home / Self Care | Payer: 59 | Attending: Obstetrics and Gynecology | Admitting: Obstetrics and Gynecology

## 2022-10-13 DIAGNOSIS — I10 Essential (primary) hypertension: Secondary | ICD-10-CM | POA: Diagnosis not present

## 2022-10-13 DIAGNOSIS — D219 Benign neoplasm of connective and other soft tissue, unspecified: Secondary | ICD-10-CM | POA: Diagnosis present

## 2022-10-13 DIAGNOSIS — N8003 Adenomyosis of the uterus: Secondary | ICD-10-CM | POA: Diagnosis not present

## 2022-10-13 DIAGNOSIS — N841 Polyp of cervix uteri: Secondary | ICD-10-CM | POA: Diagnosis not present

## 2022-10-13 DIAGNOSIS — D259 Leiomyoma of uterus, unspecified: Secondary | ICD-10-CM

## 2022-10-13 DIAGNOSIS — Z01818 Encounter for other preprocedural examination: Secondary | ICD-10-CM

## 2022-10-13 DIAGNOSIS — D251 Intramural leiomyoma of uterus: Principal | ICD-10-CM | POA: Insufficient documentation

## 2022-10-13 HISTORY — DX: Leiomyoma of uterus, unspecified: D25.9

## 2022-10-13 HISTORY — DX: Elevated blood-pressure reading, without diagnosis of hypertension: R03.0

## 2022-10-13 HISTORY — DX: Anxiety disorder, unspecified: F41.9

## 2022-10-13 HISTORY — DX: Family history of other specified conditions: Z84.89

## 2022-10-13 HISTORY — PX: HYSTERECTOMY ABDOMINAL WITH SALPINGECTOMY: SHX6725

## 2022-10-13 LAB — TYPE AND SCREEN
ABO/RH(D): B POS
Antibody Screen: NEGATIVE

## 2022-10-13 LAB — POCT PREGNANCY, URINE: Preg Test, Ur: NEGATIVE

## 2022-10-13 LAB — ABO/RH: ABO/RH(D): B POS

## 2022-10-13 SURGERY — HYSTERECTOMY, TOTAL, ABDOMINAL, WITH SALPINGECTOMY
Anesthesia: General | Site: Abdomen | Laterality: Bilateral

## 2022-10-13 MED ORDER — DEXAMETHASONE SODIUM PHOSPHATE 10 MG/ML IJ SOLN
INTRAMUSCULAR | Status: AC
  Filled 2022-10-13: qty 1

## 2022-10-13 MED ORDER — ACETAMINOPHEN 10 MG/ML IV SOLN
INTRAVENOUS | Status: AC
  Filled 2022-10-13: qty 100

## 2022-10-13 MED ORDER — ONDANSETRON HCL 4 MG/2ML IJ SOLN: 4.0000 mg | Freq: Four times a day (QID) | INTRAMUSCULAR | Status: DC | PRN

## 2022-10-13 MED ORDER — DEXAMETHASONE SODIUM PHOSPHATE 10 MG/ML IJ SOLN
INTRAMUSCULAR | Status: DC | PRN
  Administered 2022-10-13: 10 mg via INTRAVENOUS

## 2022-10-13 MED ORDER — ROCURONIUM BROMIDE 10 MG/ML (PF) SYRINGE
PREFILLED_SYRINGE | INTRAVENOUS | Status: DC | PRN
  Administered 2022-10-13: 20 mg via INTRAVENOUS
  Administered 2022-10-13: 80 mg via INTRAVENOUS

## 2022-10-13 MED ORDER — 0.9 % SODIUM CHLORIDE (POUR BTL) OPTIME
TOPICAL | Status: DC | PRN
  Administered 2022-10-13: 1000 mL

## 2022-10-13 MED ORDER — DOCUSATE SODIUM 100 MG PO CAPS
100.0000 mg | ORAL_CAPSULE | Freq: Two times a day (BID) | ORAL | Status: DC
  Administered 2022-10-13 (×2): 100 mg via ORAL

## 2022-10-13 MED ORDER — MIDAZOLAM HCL 2 MG/2ML IJ SOLN
INTRAMUSCULAR | Status: AC
  Filled 2022-10-13: qty 2

## 2022-10-13 MED ORDER — HYDROMORPHONE HCL 1 MG/ML IJ SOLN
1.0000 mg | INTRAMUSCULAR | Status: DC | PRN
  Administered 2022-10-13: 1 mg via INTRAVENOUS

## 2022-10-13 MED ORDER — LACTATED RINGERS IV SOLN: INTRAVENOUS | Status: DC

## 2022-10-13 MED ORDER — ONDANSETRON HCL 4 MG/2ML IJ SOLN
INTRAMUSCULAR | Status: AC
  Filled 2022-10-13: qty 2

## 2022-10-13 MED ORDER — POVIDONE-IODINE 10 % EX SWAB: 2.0000 | Freq: Once | CUTANEOUS | Status: DC

## 2022-10-13 MED ORDER — PROMETHAZINE HCL 25 MG/ML IJ SOLN: 6.2500 mg | INTRAMUSCULAR | Status: DC | PRN

## 2022-10-13 MED ORDER — MIDAZOLAM HCL 5 MG/5ML IJ SOLN
INTRAMUSCULAR | Status: DC | PRN
  Administered 2022-10-13: 2 mg via INTRAVENOUS

## 2022-10-13 MED ORDER — DOCUSATE SODIUM 100 MG PO CAPS
ORAL_CAPSULE | ORAL | Status: AC
  Filled 2022-10-13: qty 1

## 2022-10-13 MED ORDER — OXYCODONE-ACETAMINOPHEN 5-325 MG PO TABS
ORAL_TABLET | ORAL | Status: AC
  Filled 2022-10-13: qty 2

## 2022-10-13 MED ORDER — DEXTROSE-NACL 5-0.9 % IV SOLN: INTRAVENOUS | Status: DC

## 2022-10-13 MED ORDER — ROCURONIUM BROMIDE 10 MG/ML (PF) SYRINGE
PREFILLED_SYRINGE | INTRAVENOUS | Status: AC
  Filled 2022-10-13: qty 10

## 2022-10-13 MED ORDER — KETOROLAC TROMETHAMINE 30 MG/ML IJ SOLN
INTRAMUSCULAR | Status: AC
  Filled 2022-10-13: qty 1

## 2022-10-13 MED ORDER — OXYCODONE HCL 5 MG PO TABS
ORAL_TABLET | ORAL | Status: AC
  Filled 2022-10-13: qty 1

## 2022-10-13 MED ORDER — HYDROMORPHONE HCL 1 MG/ML IJ SOLN
0.2500 mg | INTRAMUSCULAR | Status: DC | PRN
  Administered 2022-10-13 (×4): 0.5 mg via INTRAVENOUS

## 2022-10-13 MED ORDER — KETOROLAC TROMETHAMINE 15 MG/ML IJ SOLN
30.0000 mg | Freq: Four times a day (QID) | INTRAMUSCULAR | Status: DC
  Administered 2022-10-13 – 2022-10-14 (×3): 30 mg via INTRAVENOUS

## 2022-10-13 MED ORDER — PROPOFOL 10 MG/ML IV BOLUS
INTRAVENOUS | Status: AC
  Filled 2022-10-13: qty 20

## 2022-10-13 MED ORDER — ONDANSETRON HCL 4 MG PO TABS: 4.0000 mg | ORAL_TABLET | Freq: Four times a day (QID) | ORAL | Status: DC | PRN

## 2022-10-13 MED ORDER — FENTANYL CITRATE (PF) 100 MCG/2ML IJ SOLN
INTRAMUSCULAR | Status: DC | PRN
  Administered 2022-10-13: 100 ug via INTRAVENOUS

## 2022-10-13 MED ORDER — ARTIFICIAL TEARS OPHTHALMIC OINT
TOPICAL_OINTMENT | OPHTHALMIC | Status: AC
  Filled 2022-10-13: qty 3.5

## 2022-10-13 MED ORDER — HYDROMORPHONE HCL 2 MG/ML IJ SOLN
INTRAMUSCULAR | Status: AC
  Filled 2022-10-13: qty 1

## 2022-10-13 MED ORDER — OXYCODONE HCL 5 MG PO TABS
5.0000 mg | ORAL_TABLET | Freq: Once | ORAL | Status: AC | PRN
  Administered 2022-10-13: 5 mg via ORAL

## 2022-10-13 MED ORDER — PROPOFOL 10 MG/ML IV BOLUS
INTRAVENOUS | Status: DC | PRN
  Administered 2022-10-13: 50 mg via INTRAVENOUS
  Administered 2022-10-13: 150 mg via INTRAVENOUS

## 2022-10-13 MED ORDER — OXYCODONE HCL 5 MG/5ML PO SOLN: 5.0000 mg | Freq: Once | ORAL | Status: AC | PRN

## 2022-10-13 MED ORDER — KETOROLAC TROMETHAMINE 30 MG/ML IJ SOLN
INTRAMUSCULAR | Status: DC | PRN
  Administered 2022-10-13: 30 mg via INTRAVENOUS

## 2022-10-13 MED ORDER — SODIUM CHLORIDE 0.9 % IV SOLN
2.0000 g | INTRAVENOUS | Status: AC
  Administered 2022-10-13: 2 g via INTRAVENOUS

## 2022-10-13 MED ORDER — KETOROLAC TROMETHAMINE 15 MG/ML IJ SOLN: 15.0000 mg | Freq: Four times a day (QID) | INTRAMUSCULAR | Status: DC

## 2022-10-13 MED ORDER — DEXMEDETOMIDINE HCL IN NACL 80 MCG/20ML IV SOLN
INTRAVENOUS | Status: AC
  Filled 2022-10-13: qty 20

## 2022-10-13 MED ORDER — ONDANSETRON HCL 4 MG/2ML IJ SOLN
INTRAMUSCULAR | Status: DC | PRN
  Administered 2022-10-13: 4 mg via INTRAVENOUS

## 2022-10-13 MED ORDER — SUGAMMADEX SODIUM 200 MG/2ML IV SOLN
INTRAVENOUS | Status: DC | PRN
  Administered 2022-10-13: 200 mg via INTRAVENOUS

## 2022-10-13 MED ORDER — HYDROMORPHONE HCL 1 MG/ML IJ SOLN
INTRAMUSCULAR | Status: AC
  Filled 2022-10-13: qty 1

## 2022-10-13 MED ORDER — LIDOCAINE HCL (PF) 2 % IJ SOLN
INTRAMUSCULAR | Status: AC
  Filled 2022-10-13: qty 5

## 2022-10-13 MED ORDER — OXYCODONE-ACETAMINOPHEN 5-325 MG PO TABS
1.0000 | ORAL_TABLET | ORAL | Status: DC | PRN
  Administered 2022-10-13 – 2022-10-14 (×3): 2 via ORAL

## 2022-10-13 MED ORDER — LIDOCAINE 2% (20 MG/ML) 5 ML SYRINGE
INTRAMUSCULAR | Status: DC | PRN
  Administered 2022-10-13: 80 mg via INTRAVENOUS

## 2022-10-13 MED ORDER — ACETAMINOPHEN 10 MG/ML IV SOLN
1000.0000 mg | Freq: Once | INTRAVENOUS | Status: AC
  Administered 2022-10-13: 1000 mg via INTRAVENOUS

## 2022-10-13 MED ORDER — MEPERIDINE HCL 25 MG/ML IJ SOLN: 6.2500 mg | INTRAMUSCULAR | Status: DC | PRN

## 2022-10-13 MED ORDER — DIPHENHYDRAMINE HCL 50 MG/ML IJ SOLN
INTRAMUSCULAR | Status: DC | PRN
  Administered 2022-10-13: 12.5 mg via INTRAVENOUS

## 2022-10-13 MED ORDER — MENTHOL 3 MG MT LOZG: 1.0000 | LOZENGE | OROMUCOSAL | Status: DC | PRN

## 2022-10-13 MED ORDER — DEXMEDETOMIDINE HCL IN NACL 80 MCG/20ML IV SOLN
INTRAVENOUS | Status: DC | PRN
  Administered 2022-10-13: 12 ug via BUCCAL

## 2022-10-13 MED ORDER — AMISULPRIDE (ANTIEMETIC) 5 MG/2ML IV SOLN: 10.0000 mg | Freq: Once | INTRAVENOUS | Status: DC | PRN

## 2022-10-13 MED ORDER — SODIUM CHLORIDE 0.9 % IV SOLN
INTRAVENOUS | Status: AC
  Filled 2022-10-13: qty 2

## 2022-10-13 MED ORDER — FENTANYL CITRATE (PF) 100 MCG/2ML IJ SOLN
INTRAMUSCULAR | Status: AC
  Filled 2022-10-13: qty 2

## 2022-10-13 MED ORDER — HYDROMORPHONE HCL 1 MG/ML IJ SOLN
INTRAMUSCULAR | Status: DC | PRN
  Administered 2022-10-13: .5 mg via INTRAVENOUS

## 2022-10-13 MED ORDER — OXYCODONE HCL 5 MG PO TABS
5.0000 mg | ORAL_TABLET | Freq: Once | ORAL | Status: AC
  Administered 2022-10-13: 5 mg via ORAL

## 2022-10-13 SURGICAL SUPPLY — 32 items
ADH SKN CLS APL DERMABOND .7 (GAUZE/BANDAGES/DRESSINGS)
CNTNR URN SCR LID CUP LEK RST (MISCELLANEOUS) ×1 IMPLANT
CONT SPEC 4OZ STRL OR WHT (MISCELLANEOUS) ×1
DERMABOND ADVANCED .7 DNX12 (GAUZE/BANDAGES/DRESSINGS) IMPLANT
DRAPE LAPAROTOMY T 102X78X121 (DRAPES) ×1 IMPLANT
DRAPE WARM FLUID 44X44 (DRAPES) IMPLANT
DRSG OPSITE POSTOP 4X10 (GAUZE/BANDAGES/DRESSINGS) ×1 IMPLANT
DURAPREP 26ML APPLICATOR (WOUND CARE) ×1 IMPLANT
GAUZE 4X4 16PLY ~~LOC~~+RFID DBL (SPONGE) IMPLANT
GLOVE BIO SURGEON STRL SZ 6.5 (GLOVE) ×1 IMPLANT
GLOVE BIOGEL PI IND STRL 7.0 (GLOVE) ×3 IMPLANT
GOWN STRL REUS W/TWL LRG LVL3 (GOWN DISPOSABLE) ×3 IMPLANT
HIBICLENS CHG 4% 4OZ BTL (MISCELLANEOUS) ×1 IMPLANT
KIT TURNOVER CYSTO (KITS) ×1 IMPLANT
NS IRRIG 1000ML POUR BTL (IV SOLUTION) ×1 IMPLANT
PACK ABDOMINAL GYN (CUSTOM PROCEDURE TRAY) ×1 IMPLANT
PAD OB MATERNITY 4.3X12.25 (PERSONAL CARE ITEMS) ×1 IMPLANT
PROTECTOR NERVE ULNAR (MISCELLANEOUS) ×2 IMPLANT
RTRCTR C-SECT PINK 25CM LRG (MISCELLANEOUS) IMPLANT
SLEEVE SCD COMPRESS KNEE MED (STOCKING) ×1 IMPLANT
SPONGE T-LAP 18X18 ~~LOC~~+RFID (SPONGE) ×1 IMPLANT
SUT MNCRL 0 MO-4 VIOLET 18 CR (SUTURE) ×2 IMPLANT
SUT MNCRL 0 VIOLET 6X18 (SUTURE) ×1 IMPLANT
SUT MON AB-0 CT1 36 (SUTURE) ×1 IMPLANT
SUT MONOCRYL 0 6X18 (SUTURE) ×1
SUT MONOCRYL 0 MO 4 18  CR/8 (SUTURE) ×2
SUT PDS AB 0 CTX 60 (SUTURE) ×1 IMPLANT
SUT PLAIN 2 0 XLH (SUTURE) IMPLANT
SUT VIC AB 3-0 X1 27 (SUTURE) IMPLANT
SUT VIC AB 4-0 KS 27 (SUTURE) ×1 IMPLANT
TOWEL OR 17X24 6PK STRL BLUE (TOWEL DISPOSABLE) ×2 IMPLANT
TRAY FOLEY W/BAG SLVR 14FR LF (SET/KITS/TRAYS/PACK) ×1 IMPLANT

## 2022-10-13 NOTE — Anesthesia Preprocedure Evaluation (Signed)
Anesthesia Evaluation  Patient identified by MRN, date of birth, ID band Patient awake    Reviewed: Allergy & Precautions, H&P , NPO status , Patient's Chart, lab work & pertinent test results  Airway Mallampati: II  TM Distance: >3 FB Neck ROM: Full    Dental no notable dental hx.    Pulmonary neg pulmonary ROS   Pulmonary exam normal breath sounds clear to auscultation       Cardiovascular negative cardio ROS Normal cardiovascular exam Rhythm:Regular Rate:Normal     Neuro/Psych   Anxiety     negative neurological ROS  negative psych ROS   GI/Hepatic negative GI ROS, Neg liver ROS,,,  Endo/Other  negative endocrine ROS    Renal/GU negative Renal ROS  negative genitourinary   Musculoskeletal negative musculoskeletal ROS (+)    Abdominal   Peds negative pediatric ROS (+)  Hematology negative hematology ROS (+)   Anesthesia Other Findings   Reproductive/Obstetrics negative OB ROS                             Anesthesia Physical Anesthesia Plan  ASA: 2  Anesthesia Plan: General   Post-op Pain Management: Dilaudid IV   Induction: Intravenous  PONV Risk Score and Plan: 3 and Ondansetron, Dexamethasone, Midazolam and Treatment may vary due to age or medical condition  Airway Management Planned: Oral ETT  Additional Equipment:   Intra-op Plan:   Post-operative Plan: Extubation in OR  Informed Consent: I have reviewed the patients History and Physical, chart, labs and discussed the procedure including the risks, benefits and alternatives for the proposed anesthesia with the patient or authorized representative who has indicated his/her understanding and acceptance.     Dental advisory given  Plan Discussed with: CRNA  Anesthesia Plan Comments:        Anesthesia Quick Evaluation

## 2022-10-13 NOTE — Progress Notes (Signed)
Procedure d/w patient R/b/a reviewed, questions answered and informed consent

## 2022-10-13 NOTE — Anesthesia Procedure Notes (Signed)
Procedure Name: Intubation Date/Time: 10/13/2022 7:28 AM  Performed by: Bishop Limbo, CRNAPre-anesthesia Checklist: Patient identified, Emergency Drugs available, Suction available and Patient being monitored Patient Re-evaluated:Patient Re-evaluated prior to induction Oxygen Delivery Method: Circle System Utilized Preoxygenation: Pre-oxygenation with 100% oxygen Induction Type: IV induction Ventilation: Mask ventilation without difficulty Laryngoscope Size: Mac and 3 Grade View: Grade I Tube type: Oral Tube size: 7.0 mm Number of attempts: 1 Airway Equipment and Method: Stylet and Bite block Placement Confirmation: ETT inserted through vocal cords under direct vision, positive ETCO2 and breath sounds checked- equal and bilateral Secured at: 22 cm Tube secured with: Tape Dental Injury: Teeth and Oropharynx as per pre-operative assessment

## 2022-10-13 NOTE — Anesthesia Postprocedure Evaluation (Signed)
Anesthesia Post Note  Patient: Jennifer Wiley  Procedure(s) Performed: ABDOMINAL HYSTERECTOMY WITH BILATERAL SALPINGECTOMY (Bilateral: Abdomen)     Patient location during evaluation: PACU Anesthesia Type: General Level of consciousness: awake and alert Pain management: pain level controlled Vital Signs Assessment: post-procedure vital signs reviewed and stable Respiratory status: spontaneous breathing, nonlabored ventilation and respiratory function stable Cardiovascular status: blood pressure returned to baseline and stable Postop Assessment: no apparent nausea or vomiting Anesthetic complications: no   No notable events documented.  Last Vitals:  Vitals:   10/13/22 1000 10/13/22 1015  BP: (!) 138/96 136/70  Pulse: 93 90  Resp: 13 14  Temp: 36.9 C 36.8 C  SpO2: 96% 97%    Last Pain:  Vitals:   10/13/22 0945  TempSrc:   PainSc: 6                  Lowella Curb

## 2022-10-13 NOTE — Progress Notes (Signed)
Day of Surgery Procedure(s) (LRB): ABDOMINAL HYSTERECTOMY WITH BILATERAL SALPINGECTOMY (Bilateral)  Subjective: Patient reports incisional pain and tolerating PO.    Objective: I have reviewed patient's vital signs, intake and output, and medications.  General: alert and cooperative GI: normal findings: soft, ND, appropriately tender Extremities: extremities normal, atraumatic, no cyanosis or edema  Assessment: s/p Procedure(s): ABDOMINAL HYSTERECTOMY WITH BILATERAL SALPINGECTOMY (Bilateral): stable and progressing well  Plan: Advance diet Encourage ambulation Advance to PO medication  LOS: 0 days    Zelphia Cairo, MD 10/13/2022, 2:10 PM

## 2022-10-13 NOTE — Transfer of Care (Signed)
Immediate Anesthesia Transfer of Care Note  Patient: Jennifer Wiley  Procedure(s) Performed: ABDOMINAL HYSTERECTOMY WITH BILATERAL SALPINGECTOMY (Bilateral: Abdomen)  Patient Location: PACU  Anesthesia Type:General  Level of Consciousness: awake, alert , oriented, and patient cooperative  Airway & Oxygen Therapy: Patient Spontanous Breathing  Post-op Assessment: Report given to RN and Post -op Vital signs reviewed and stable  Post vital signs: Reviewed and stable  Last Vitals:  Vitals Value Taken Time  BP 139/91 10/13/22 0900  Temp    Pulse 92 10/13/22 0900  Resp 14 10/13/22 0900  SpO2 100 % 10/13/22 0900  Vitals shown include unvalidated device data.  Last Pain:  Vitals:   10/13/22 9179  TempSrc: Oral  PainSc: 0-No pain      Patients Stated Pain Goal: 5 (10/13/22 1505)  Complications: No notable events documented.

## 2022-10-13 NOTE — Op Note (Signed)
Hysterectomy Procedure Note  Indications: fibroids  Pre-operative Diagnosis: fibroids  Post-operative Diagnosis: same  Operation: Total abdominal hysterectomy, bilateral salpingectomy  Surgeon: Zelphia Cairo   Assistants: Martina Sinner  Anesthesia: General endotracheal anesthesia   Procedure Details  The patient was seen in the Holding Room. The risks, benefits, complications, treatment options, and expected outcomes were discussed with the patient.  The patient concurred with the proposed plan, giving informed consent.  The site of surgery properly noted/marked. The patient was taken to Operating Room # 1, identified as Jennifer Wiley and the procedure verified as Total abdominal hysterectomy, bilateral salpingectomy. A Time Out was held and the above information confirmed.  After induction of anesthesia, the patient was draped and prepped in the usual sterile manner. Pt was placed in supine position after anesthesia and draped and prepped in the usual sterile manner. Foley catheter was placed.  A transverse incision was made and carried through the subcutaneous tissue to the fascia. Fascial incision was made and extended sharply. The rectus muscles were separated. The peritoneum was identified and entered. Peritoneal incision was extended longitudinally.  The above findings were noted. Alexis retractor was placed and bowel was packed away from the surgical site.   The round ligaments were identified, cut, and ligated with 0-monocryl. The anterior peritoneal reflection was incised and the bladder was dissected off the lower uterine segment. The retroperitoneal space was explored and the ureters were identified bilaterally. The right utero-ovarian ligament and proximal fallopian tube were grasped, cut, and suture ligated with 0-monocryl. The left utero-ovarian ligament and proximal fallopian tube were grasped, cut and suture ligated with 0-monocryl. Hemostasis  was observed. The uterine  vessels were skeletonized, then clamped, cut and suture ligated with 0-Vicryl suture. Serial pedicles of the cardinal and utero-sacral ligaments were clamped, cut, and suture ligated with 0-monocryl. Entrance was made into the vagina and the uterus removed. Vaginal cuff angle sutures were placed incorporating the utero-sacral ligaments for support. The vaginal cuff was then closed with  0-monocryl. Lavage was carried out until clear. Hemostasis was observed. Bilateral fallopian tubes were grasped with Kelly clamps, excised, and suture ligated.  Hemostasis was noted  Retractor and all packing was removed from the abdomen. Peritoneum closed with 0 monocryl running stitch.  The fascia was approximated with running sutures of 0-PDS. Lavage was again carried out. Hemostasis was observed. The skin was approximated with staples.  Instrument, sponge, and needle counts were correct prior to abdominal closure and at the conclusion of the case.   Findings: Large fibroid uterus  Estimated Blood Loss:  less than 100 mL         Drains: foley         Specimens: uterus and bilateral fallopian tubes         Complications:  None; patient tolerated the procedure well.         Disposition: PACU - hemodynamically stable.         Condition: stable  Attending Attestation: I was present and scrubbed for the entire procedure.

## 2022-10-14 ENCOUNTER — Encounter (HOSPITAL_BASED_OUTPATIENT_CLINIC_OR_DEPARTMENT_OTHER): Payer: Self-pay | Admitting: Obstetrics and Gynecology

## 2022-10-14 DIAGNOSIS — D251 Intramural leiomyoma of uterus: Secondary | ICD-10-CM | POA: Diagnosis not present

## 2022-10-14 LAB — CBC
HCT: 35 % — ABNORMAL LOW (ref 36.0–46.0)
Hemoglobin: 11.5 g/dL — ABNORMAL LOW (ref 12.0–15.0)
MCH: 31.5 pg (ref 26.0–34.0)
MCHC: 32.9 g/dL (ref 30.0–36.0)
MCV: 95.9 fL (ref 80.0–100.0)
Platelets: 218 10*3/uL (ref 150–400)
RBC: 3.65 MIL/uL — ABNORMAL LOW (ref 3.87–5.11)
RDW: 13.2 % (ref 11.5–15.5)
WBC: 13.7 10*3/uL — ABNORMAL HIGH (ref 4.0–10.5)
nRBC: 0 % (ref 0.0–0.2)

## 2022-10-14 MED ORDER — OXYCODONE-ACETAMINOPHEN 5-325 MG PO TABS: 1.0000 | ORAL_TABLET | ORAL | 0 refills | Status: DC | PRN

## 2022-10-14 MED ORDER — OXYCODONE-ACETAMINOPHEN 5-325 MG PO TABS
ORAL_TABLET | ORAL | Status: AC
  Filled 2022-10-14: qty 2

## 2022-10-14 MED ORDER — KETOROLAC TROMETHAMINE 30 MG/ML IJ SOLN
INTRAMUSCULAR | Status: AC
  Filled 2022-10-14: qty 1

## 2022-10-14 MED ORDER — IBUPROFEN 600 MG PO TABS: 600.0000 mg | ORAL_TABLET | Freq: Four times a day (QID) | ORAL | 1 refills | Status: AC | PRN

## 2022-10-14 NOTE — Progress Notes (Signed)
1 Day Post-Op Procedure(s) (LRB): ABDOMINAL HYSTERECTOMY WITH BILATERAL SALPINGECTOMY (Bilateral)  Subjective: Patient reports tolerating PO, + flatus, and no problems voiding.    Objective: I have reviewed patient's vital signs, intake and output, medications, and labs.  General: alert and cooperative GI: soft, non-tender; bowel sounds normal; no masses,  no organomegaly Extremities: extremities normal, atraumatic, no cyanosis or edema  Assessment: s/p Procedure(s): ABDOMINAL HYSTERECTOMY WITH BILATERAL SALPINGECTOMY (Bilateral): stable, progressing well, and tolerating diet  Plan: Discharge home  LOS: 0 days    Zelphia Cairo, MD 10/14/2022, 8:16 AM

## 2022-10-17 LAB — SURGICAL PATHOLOGY

## 2022-10-24 NOTE — Discharge Summary (Signed)
Physician Discharge Summary  Patient ID: Jennifer Wiley MRN: [id number] DOB/AGE: [birth year] 52 y.o.  Admit date: 10/13/2022 Discharge date: 10/24/2022  Admission Diagnoses: fibroids  Discharge Diagnoses:  Principal Problem:   Fibroids   Discharged Condition: good  Hospital Course: Pt was taken to postop observation for overnight care.  Initially, her pain was controlled with IV medications.  Once she was able to tolerate oral intake, she was given PO pain medications prn.  Once she was able to ambulate, foley catheter was removed and she was able to void without difficulty.  Vitals and labs remained stable.  She was ready for discharge on POD1.  Consults: None  Significant Diagnostic Studies: labs: cbc  Treatments: surgery: TAH/Bs  Discharge Exam: Blood pressure (!) 146/87, pulse 86, temperature 98.2 F (36.8 C), resp. rate 18, height  (1.676 m), weight 81.7 kg, last menstrual period 09/05/2022, SpO2 96 %. General appearance: alert and cooperative Resp: clear to auscultation bilaterally GI: soft, non-tender; bowel sounds normal; no masses,  no organomegaly Extremities: extremities normal, atraumatic, no cyanosis or edema Incision/Wound:  Disposition: Discharge disposition: 01-Home or Self Care        Allergies as of 10/14/2022   No Known Allergies      Medication List     STOP taking these medications    gabapentin 100 MG capsule Commonly known as: NEURONTIN   norethindrone-ethinyl estradiol-FE 1-20 MG-MCG tablet Commonly known as: LOESTRIN FE       TAKE these medications    buPROPion 150 MG 12 hr tablet Commonly known as: WELLBUTRIN SR Take 150 mg by mouth 2 (two) times daily.   ibuprofen 600 MG tablet Commonly known as: ADVIL Take 1 tablet (600 mg total) by mouth every 6 (six) hours as needed for moderate pain. What changed:  medication strength how much to take reasons to take this Notes to patient: May take first dose at 9:30 am    oxyCODONE-acetaminophen 5-325 MG tablet Commonly known as: PERCOCET/ROXICET Take 1-2 tablets by mouth every 4 (four) hours as needed for severe pain. Notes to patient: May take next dose at 12:30 pm         Follow-up Information     Zelphia Cairo, MD Follow up in 2 week(s).   Specialty: Obstetrics and Gynecology Contact information: [address] [place], [address] [place] [address] [phone]                 Signed: Zelphia Cairo 10/24/2022, 7:36 AM

## 2022-10-31 ENCOUNTER — Encounter: Payer: Self-pay | Admitting: Internal Medicine

## 2022-11-14 ENCOUNTER — Encounter: Payer: Self-pay | Admitting: Gastroenterology

## 2022-11-14 ENCOUNTER — Ambulatory Visit (INDEPENDENT_AMBULATORY_CARE_PROVIDER_SITE_OTHER): Payer: 59 | Admitting: Gastroenterology

## 2022-11-14 VITALS — BP 144/91 | HR 96 | Temp 98.6°F | Ht 66.5 in | Wt 180.0 lb

## 2022-11-14 DIAGNOSIS — R110 Nausea: Secondary | ICD-10-CM

## 2022-11-14 DIAGNOSIS — Z1211 Encounter for screening for malignant neoplasm of colon: Secondary | ICD-10-CM

## 2022-11-14 MED ORDER — NA SULFATE-K SULFATE-MG SULF 17.5-3.13-1.6 GM/177ML PO SOLN: 1.0000 | Freq: Once | ORAL | 0 refills | Status: AC

## 2022-11-14 MED ORDER — PANTOPRAZOLE SODIUM 40 MG PO TBEC: 40.0000 mg | DELAYED_RELEASE_TABLET | Freq: Every day | ORAL | 5 refills | Status: AC

## 2022-11-14 NOTE — Progress Notes (Signed)
Gastroenterology Consultation  Referring Provider:     Zelphia Cairo, MD Primary Care Physician:  Jennifer Cairo, MD Primary Gastroenterologist:  Dr. Servando Wiley     Reason for Consultation:     Abdominal pain        HPI:   Jennifer Wiley is a 52 y.o. y/o female referred for consultation & management of abdominal pain by Dr. Zelphia Cairo, MD. This patient underwent a hysterectomy last month for fibroids.  The patient continued to have abdominal pain and was sent to [facility] in [place] but I was notified by the patient's husband that the patient was having a lot of upper GI symptoms that had been present prior to having the procedure but had not resolved due to the weight in [place] for an appointment was in August I was contacted to see if we can see her sooner..  The patient has not had a cholecystectomy in the past. The patient reports that her abdominal pain is in the right upper and left upper abdomen with bandlike pain across the middle of the abdomen.  She states the pain is a burning sensation.  She states it is worse when she is sitting upright and she feels better when she is laying down.  She also reports that sometimes when she eats it makes it worse.  The patient has nausea usually in the morning and typically every other morning since the surgery for her fibroids.  There is no report of any unexplained weight loss family history of colon cancer or colon polyps or any black stools or bloody stools. The pain has woken her up from sleep at night.  She denies any change in bowel habits.  She does report that she has bloating and that the wire from her bra pushes on her sides in her abdomen make her symptoms worse.  Past Medical History:  Diagnosis Date   Anxiety    Follows w/ Dr. Renaldo Wiley, GYN.   Borderline hypertension    Per pt on 10/06/22. Pt states her doctor thinks it is being caused by the BCP she is taking.   Family history of adverse reaction to anesthesia     Patient's mother could feel both of her C-sections to some degree.   History of shingles 2020   right breast and back   Uterine fibroid     Past Surgical History:  Procedure Laterality Date   BREAST BIOPSY Left    x 2, both benign   CESAREAN SECTION  07/05/2003   HYSTERECTOMY ABDOMINAL WITH SALPINGECTOMY Bilateral 10/13/2022   Procedure: ABDOMINAL HYSTERECTOMY WITH BILATERAL SALPINGECTOMY;  Surgeon: Jennifer Cairo, MD;  Location: [facility];  Service: Gynecology;  Laterality: Bilateral;    Prior to Admission medications   Medication Sig Start Date End Date Taking? Authorizing Provider  buPROPion (WELLBUTRIN SR) 150 MG 12 hr tablet Take 150 mg by mouth 2 (two) times daily.    [provider]  ibuprofen (ADVIL) 600 MG tablet Take 1 tablet (600 mg total) by mouth every 6 (six) hours as needed for moderate pain. 10/14/22   Jennifer Cairo, MD  oxyCODONE-acetaminophen (PERCOCET/ROXICET) 5-325 MG tablet Take 1-2 tablets by mouth every 4 (four) hours as needed for severe pain. 10/14/22   Jennifer Cairo, MD    Family History  Problem Relation Age of Onset   Breast cancer Mother    Cancer Mother        breast   Heart disease Father 27   Heart attack Father  Hyperlipidemia Father    Depression Brother    Lung cancer Maternal Aunt    Lung cancer Maternal Grandfather    Colon cancer Neg Hx      Social History   Tobacco Use   Smoking status: Never   Smokeless tobacco: Never  Vaping Use   Vaping Use: Never used  Substance Use Topics   Alcohol use: Yes    Alcohol/week: 7.0 standard drinks of alcohol    Types: 7 Glasses of wine per week   Drug use: No    Allergies as of 11/14/2022   (No Known Allergies)    Review of Systems:    All systems reviewed and negative except where noted in HPI.   Physical Exam:  There were no vitals taken for this visit. No LMP recorded. General:   Alert,  Well-developed, well-nourished, pleasant and cooperative in  NAD Head:  Normocephalic and atraumatic. Eyes:  Sclera clear, no icterus.   Conjunctiva pink. Ears:  Normal auditory acuity. Neck:  Supple; no masses or thyromegaly. Lungs:  Respirations even and unlabored.  Clear throughout to auscultation.   No wheezes, crackles, or rhonchi. No acute distress. Heart:  Regular rate and rhythm; no murmurs, clicks, rubs, or gallops. Abdomen:  Normal bowel sounds.  No bruits.  Soft, positive tenderness to 1 finger palpation with flexion of the abdominal wall muscles and non-distended without masses, hepatosplenomegaly or hernias noted.  No guarding or rebound tenderness.  Positive Carnett sign.   Rectal:  Deferred.  Pulses:  Normal pulses noted. Extremities:  No clubbing or edema.  No cyanosis. Neurologic:  Alert and oriented x3;  grossly normal neurologically. Skin:  Intact without significant lesions or rashes.  No jaundice. Lymph Nodes:  No significant cervical adenopathy. Psych:  Alert and cooperative. Normal mood and affect.  Imaging Studies: No results found.  Assessment and Plan:   Jennifer Wiley is a 52 y.o. y/o female who comes in today with a history of abdominal discomfort in the upper abdomen on the right and the left and in a bandlike fashion across the middle.  The pain is reproducible by lifting the legs above the exam table and flexing the abdominal wall muscles with 1 finger light palpation on the abdominal wall muscles.  The patient likely has morning nausea every other day from nocturnal acid reflux and all started when she had the hysterectomy.  The patient will be started on pantoprazole 40 mg once a day.  The patient will also be set up for screening colonoscopy due to her age.  She will also have an upper endoscopy at the same time because of her nausea.  The patient has been explained that her symptoms are consistent with musculoskeletal pain and she should take 2 Advil 3 times a day with food to see if this helps her musculoskeletal  pain.  The patient has been explained the plan and agrees with it.    Midge Minium, MD. Clementeen Graham    Note: This dictation was prepared with Dragon dictation along with smaller phrase technology. Any transcriptional errors that result from this process are unintentional.

## 2022-11-15 NOTE — Addendum Note (Signed)
Addended by: Roena Malady on: 11/15/2022 09:08 AM   Modules accepted: Orders

## 2022-11-21 ENCOUNTER — Encounter: Payer: Self-pay | Admitting: Gastroenterology

## 2022-11-22 NOTE — Anesthesia Preprocedure Evaluation (Addendum)
Anesthesia Evaluation  Patient identified by MRN, date of birth, ID band Patient awake    Reviewed: Allergy & Precautions, H&P , NPO status , Patient's Chart, lab work & pertinent test results  History of Anesthesia Complications (+) Family history of anesthesia reaction  Airway Mallampati: II  TM Distance: >3 FB Neck ROM: Full    Dental no notable dental hx.    Pulmonary neg pulmonary ROS   Pulmonary exam normal breath sounds clear to auscultation       Cardiovascular negative cardio ROS Normal cardiovascular exam Rhythm:Regular Rate:Normal  Patient reports white coat syndrome, but BP was high even for that today; patient plans to see primary care physician about this    Neuro/Psych  PSYCHIATRIC DISORDERS Anxiety     negative neurological ROS  negative psych ROS   GI/Hepatic negative GI ROS, Neg liver ROS,GERD  ,,  Endo/Other  negative endocrine ROS    Renal/GU negative Renal ROS  negative genitourinary   Musculoskeletal negative musculoskeletal ROS (+)    Abdominal   Peds negative pediatric ROS (+)  Hematology negative hematology ROS (+)   Anesthesia Other Findings Anxiety  History of shingles Family history of adverse reaction to anesthesia--patient's mother could feel C section "to some degree" Uterine fibroid Borderline hypertension  Vertigo GERD (gastroesophageal reflux disease)     Reproductive/Obstetrics negative OB ROS                             Anesthesia Physical Anesthesia Plan  ASA: 2  Anesthesia Plan: General   Post-op Pain Management:    Induction: Intravenous  PONV Risk Score and Plan:   Airway Management Planned: Natural Airway and Nasal Cannula  Additional Equipment:   Intra-op Plan:   Post-operative Plan:   Informed Consent: I have reviewed the patients History and Physical, chart, labs and discussed the procedure including the risks, benefits  and alternatives for the proposed anesthesia with the patient or authorized representative who has indicated his/her understanding and acceptance.     Dental Advisory Given  Plan Discussed with: Anesthesiologist, CRNA and Surgeon  Anesthesia Plan Comments: (Patient consented for risks of anesthesia including but not limited to:  - adverse reactions to medications - risk of airway placement if required - damage to eyes, teeth, lips or other oral mucosa - nerve damage due to positioning  - sore throat or hoarseness - Damage to heart, brain, nerves, lungs, other parts of body or loss of life  Patient voiced understanding.)       Anesthesia Quick Evaluation

## 2022-11-23 ENCOUNTER — Telehealth: Payer: Self-pay

## 2022-11-23 NOTE — Telephone Encounter (Signed)
Pt is aware that this is for Golytely prep not suprep, pt expressed understanding

## 2022-11-23 NOTE — Telephone Encounter (Signed)
Patient has a colonoscopy and EGD schedule for Friday and the Video that [facility] sent they showed a flovor packet for the bowel prep. She is interested in getting one of these.

## 2022-11-25 ENCOUNTER — Ambulatory Visit: Payer: 59 | Admitting: Anesthesiology

## 2022-11-25 ENCOUNTER — Encounter: Payer: Self-pay | Admitting: Gastroenterology

## 2022-11-25 ENCOUNTER — Other Ambulatory Visit: Payer: Self-pay

## 2022-11-25 ENCOUNTER — Encounter
Admission: RE | Disposition: A | Discharge status: Home / Self Care | Payer: Self-pay | Source: Home / Self Care | Attending: Gastroenterology

## 2022-11-25 ENCOUNTER — Ambulatory Visit
Admission: RE | Admit: 2022-11-25 | Discharge: 2022-11-25 | Disposition: A | Discharge status: Home / Self Care | Payer: 59 | Attending: Gastroenterology | Admitting: Gastroenterology

## 2022-11-25 DIAGNOSIS — K219 Gastro-esophageal reflux disease without esophagitis: Secondary | ICD-10-CM | POA: Insufficient documentation

## 2022-11-25 DIAGNOSIS — F419 Anxiety disorder, unspecified: Secondary | ICD-10-CM | POA: Insufficient documentation

## 2022-11-25 DIAGNOSIS — R110 Nausea: Secondary | ICD-10-CM

## 2022-11-25 DIAGNOSIS — Z1211 Encounter for screening for malignant neoplasm of colon: Secondary | ICD-10-CM | POA: Diagnosis not present

## 2022-11-25 DIAGNOSIS — K635 Polyp of colon: Secondary | ICD-10-CM

## 2022-11-25 HISTORY — DX: Gastro-esophageal reflux disease without esophagitis: K21.9

## 2022-11-25 HISTORY — PX: COLONOSCOPY WITH PROPOFOL: SHX5780

## 2022-11-25 HISTORY — DX: Dizziness and giddiness: R42

## 2022-11-25 HISTORY — PX: ESOPHAGOGASTRODUODENOSCOPY (EGD) WITH PROPOFOL: SHX5813

## 2022-11-25 HISTORY — PX: POLYPECTOMY: SHX5525

## 2022-11-25 IMAGING — MG MM DIGITAL DIAGNOSTIC UNILAT*L* W/ TOMO W/ CAD
6 series · 6 of 18 positions shown · non-contrast
Comparison: Previous exams including recent screening mammogram
dated 10/28/2021.

CLINICAL DATA: Patient returns today to evaluate a possible LEFT
breast asymmetry questioned on recent screening mammogram.
TECHNIQUE: Left digital diagnostic mammography and breast tomosynthesis was
performed. The images were evaluated with computer-aided detection.;
Targeted ultrasound examination of the left breast was performed.

[L CC synth-2D]
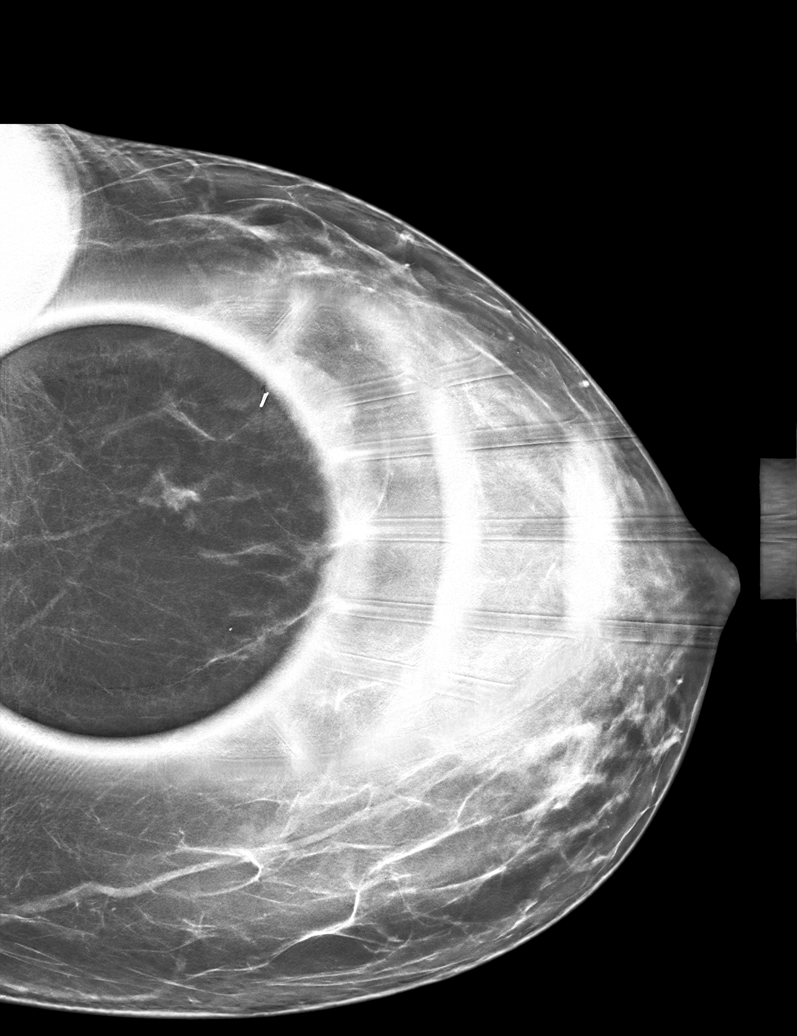

[L ML synth-2D]
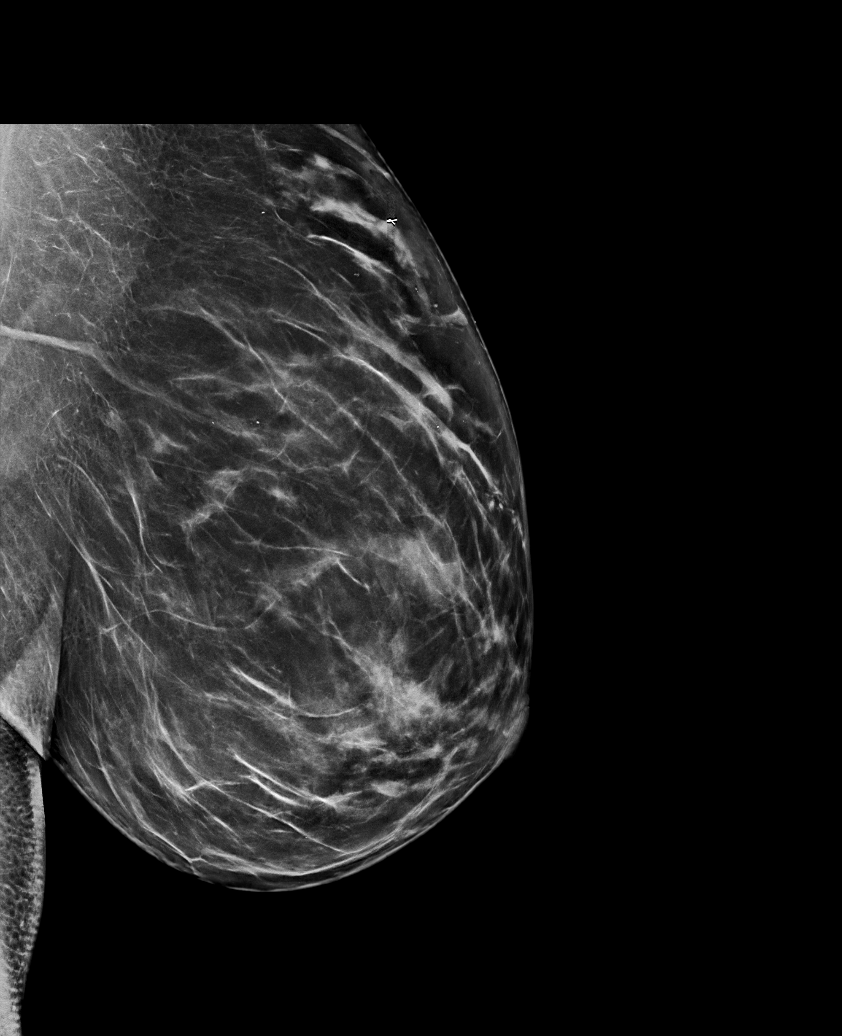

[L MLO synth-2D]
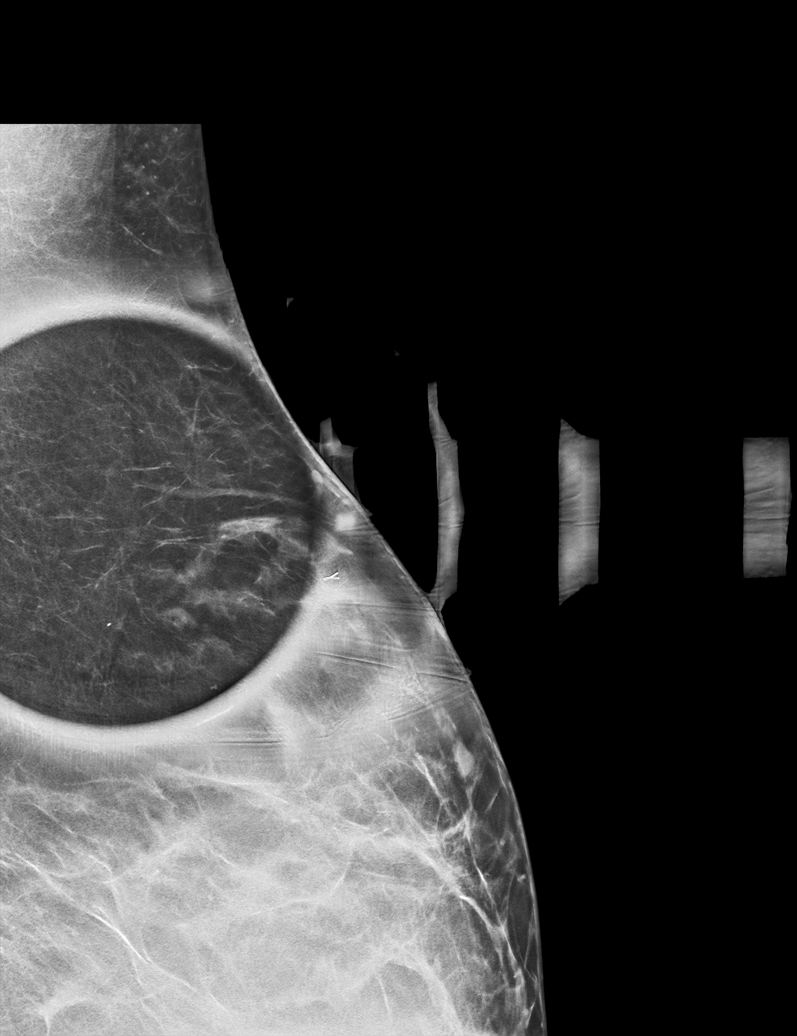

[L CC tomo · tomo slice 35/70.0]
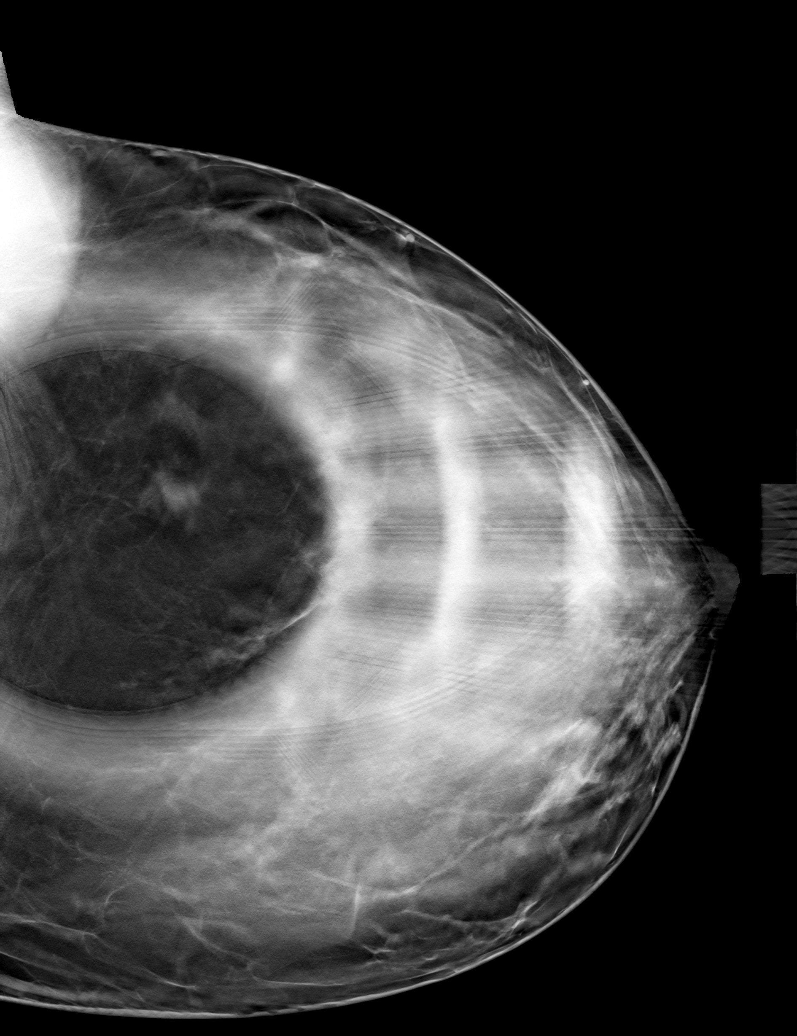

[L MLO tomo · tomo slice 30/59.0]
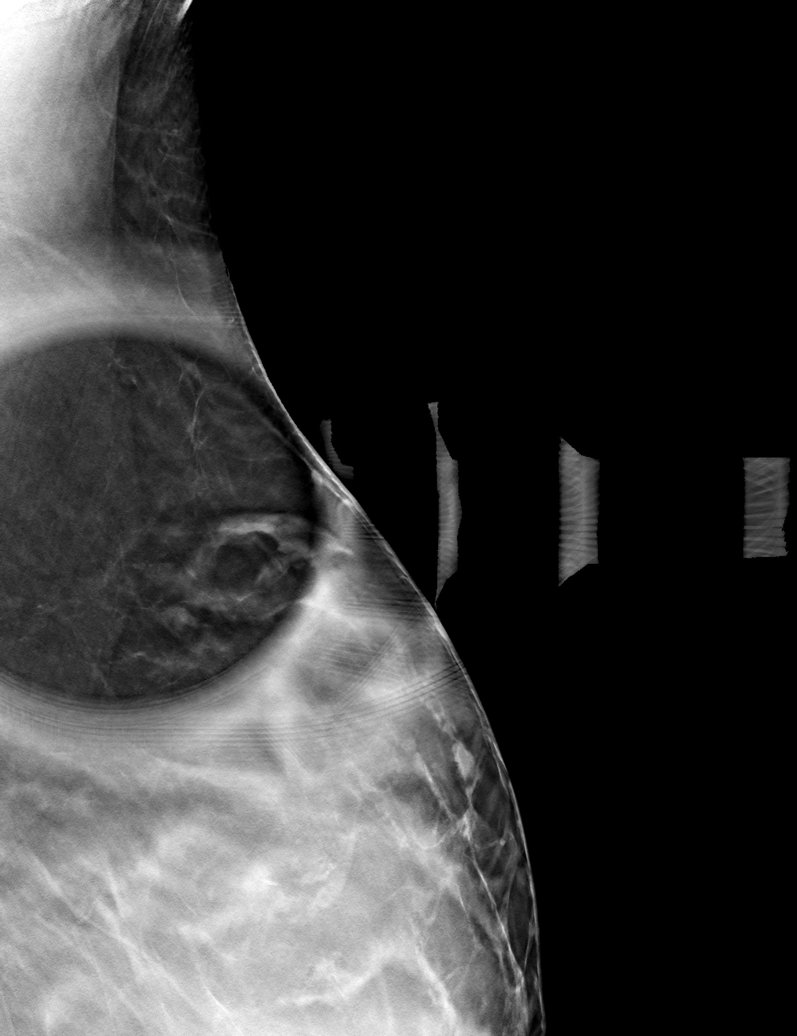

[L ML tomo · tomo slice 45/90.0]
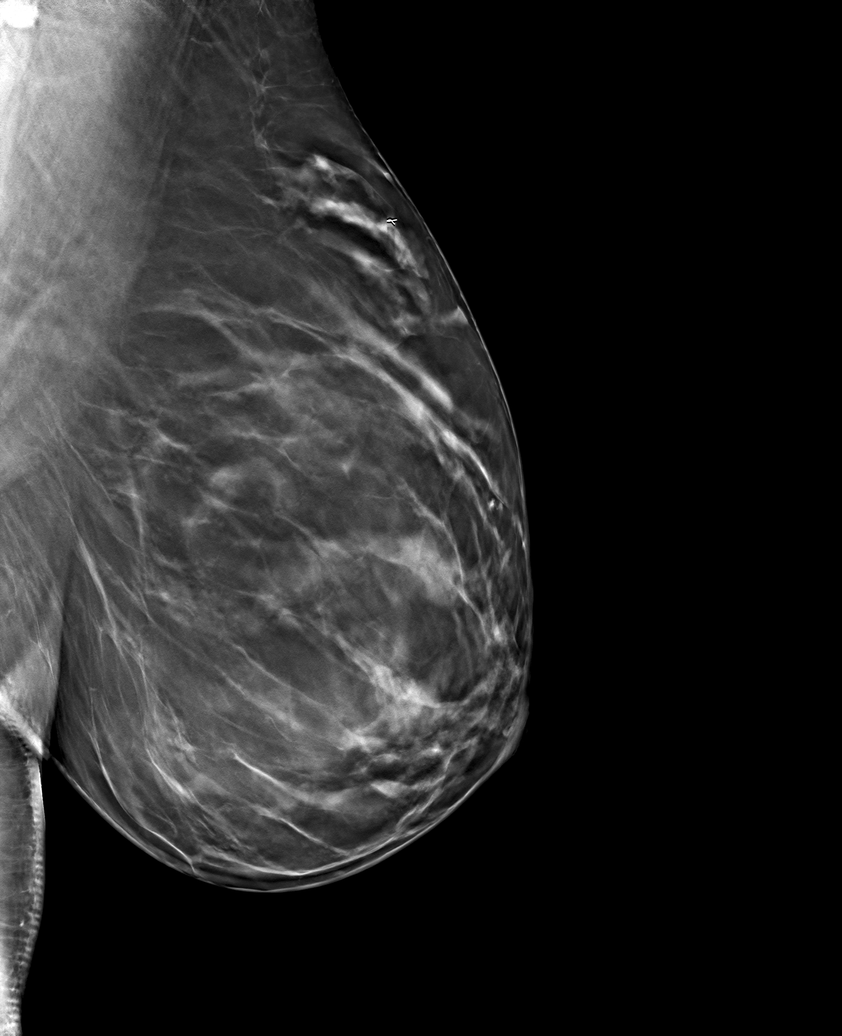

[6 of 18 positions shown; findings below may reference images not displayed]

Family history of breast cancer including mother at age 40.

Previous ultrasound-guided biopsy within the upper-outer quadrant of
the LEFT breast with benign pathology result of pseudoangiomatous
stromal hyperplasia (PASH).

EXAM:
DIGITAL DIAGNOSTIC UNILATERAL LEFT MAMMOGRAM WITH TOMOSYNTHESIS AND
CAD; ULTRASOUND LEFT BREAST LIMITED
ACR Breast Density Category c: The breast tissue is heterogeneously
dense, which may obscure small masses.
FINDINGS: On today's additional diagnostic views, including spot compression
with 3D tomosynthesis, a developing asymmetry is confirmed within
the upper-outer quadrant of the LEFT breast, at posterior depth,
best seen on spot compression CC slice 21.

Targeted ultrasound is performed, evaluating the upper-outer
quadrant of the LEFT breast, showing no definite sonographic
correlate for the developing asymmetry within the LEFT breast.
IMPRESSION: Developing asymmetry within the upper-outer quadrant of the LEFT
breast, at posterior depth, best seen on CC views, without
sonographic correlate. This may represent additional
pseudoangiomatous stromal hyperplasia (PASH). Recommend stereotactic
biopsy to exclude malignancy.

RECOMMENDATION:
Stereotactic biopsy for the developing asymmetry located within the
upper-outer quadrant of the LEFT breast.

Stereotactic biopsy is scheduled on [REDACTED].

I have discussed the findings and recommendations with the patient.
If applicable, a reminder letter will be sent to the patient
regarding the next appointment.

BI-RADS CATEGORY  4: Suspicious.

## 2022-11-25 SURGERY — COLONOSCOPY WITH PROPOFOL
Anesthesia: General | Site: Rectum

## 2022-11-25 MED ORDER — STERILE WATER FOR IRRIGATION IR SOLN
Status: DC | PRN
  Administered 2022-11-25: 1

## 2022-11-25 MED ORDER — LIDOCAINE HCL (CARDIAC) PF 100 MG/5ML IV SOSY
PREFILLED_SYRINGE | INTRAVENOUS | Status: DC | PRN
  Administered 2022-11-25: 50 mg via INTRAVENOUS

## 2022-11-25 MED ORDER — SODIUM CHLORIDE 0.9 % IV SOLN: INTRAVENOUS | Status: DC

## 2022-11-25 MED ORDER — PROPOFOL 10 MG/ML IV BOLUS
INTRAVENOUS | Status: DC | PRN
  Administered 2022-11-25: 100 mg via INTRAVENOUS
  Administered 2022-11-25 (×4): 25 mg via INTRAVENOUS
  Administered 2022-11-25: 100 mg via INTRAVENOUS
  Administered 2022-11-25: 25 mg via INTRAVENOUS

## 2022-11-25 MED ORDER — STERILE WATER FOR IRRIGATION IR SOLN
Status: DC | PRN
  Administered 2022-11-25: 60 mL

## 2022-11-25 MED ORDER — LACTATED RINGERS IV SOLN: INTRAVENOUS | Status: DC

## 2022-11-25 SURGICAL SUPPLY — 36 items
BALLN DILATOR 12-15 8 (BALLOONS)
BALLN DILATOR 15-18 8 (BALLOONS)
BALLN DILATOR CRE 0-12 8 (BALLOONS)
BALLN DILATOR ESOPH 8 10 CRE (MISCELLANEOUS) IMPLANT
BALLOON DILATOR 12-15 8 (BALLOONS) IMPLANT
BALLOON DILATOR 15-18 8 (BALLOONS) IMPLANT
BALLOON DILATOR CRE 0-12 8 (BALLOONS) IMPLANT
BLOCK BITE 60FR ADLT L/F GRN (MISCELLANEOUS) ×3 IMPLANT
CLIP HMST 235XBRD CATH ROT (MISCELLANEOUS) IMPLANT
CLIP RESOLUTION 360 11X235 (MISCELLANEOUS)
ELECT REM PT RETURN 9FT ADLT (ELECTROSURGICAL)
ELECTRODE REM PT RTRN 9FT ADLT (ELECTROSURGICAL) IMPLANT
FCP ESCP3.2XJMB 240X2.8X (MISCELLANEOUS)
FORCEPS BIOP RAD 4 LRG CAP 4 (CUTTING FORCEPS) IMPLANT
FORCEPS BIOP RJ4 240 W/NDL (MISCELLANEOUS)
FORCEPS ESCP3.2XJMB 240X2.8X (MISCELLANEOUS) IMPLANT
GOWN CVR UNV OPN BCK APRN NK (MISCELLANEOUS) ×6 IMPLANT
GOWN ISOL THUMB LOOP REG UNIV (MISCELLANEOUS) ×6
INJECTOR VARIJECT VIN23 (MISCELLANEOUS) IMPLANT
KIT DEFENDO VALVE AND CONN (KITS) IMPLANT
KIT PRC NS LF DISP ENDO (KITS) ×3 IMPLANT
KIT PROCEDURE OLYMPUS (KITS) ×3
MANIFOLD NEPTUNE II (INSTRUMENTS) ×3 IMPLANT
MARKER SPOT ENDO TATTOO 5ML (MISCELLANEOUS) IMPLANT
PROBE APC STR FIRE (PROBE) IMPLANT
RETRIEVER NET PLAT FOOD (MISCELLANEOUS) IMPLANT
RETRIEVER NET ROTH 2.5X230 LF (MISCELLANEOUS) IMPLANT
SNARE COLD EXACTO (MISCELLANEOUS) IMPLANT
SNARE SHORT THROW 13M SML OVAL (MISCELLANEOUS) IMPLANT
SNARE SHORT THROW 30M LRG OVAL (MISCELLANEOUS) IMPLANT
SNARE SNG USE RND 15MM (INSTRUMENTS) IMPLANT
SYR INFLATION 60ML (SYRINGE) IMPLANT
TRAP ETRAP POLY (MISCELLANEOUS) IMPLANT
VARIJECT INJECTOR VIN23 (MISCELLANEOUS)
WATER STERILE IRR 250ML POUR (IV SOLUTION) ×3 IMPLANT
WIRE CRE 18-20MM 8CM F G (MISCELLANEOUS) IMPLANT

## 2022-11-25 NOTE — Op Note (Signed)
[facility] Gastroenterology Patient Name: Jennifer Wiley Procedure Date: 11/25/2022 7:24 AM MRN: [id number] Account #: [id number] Date of Birth: [birth year] Admit Type: Outpatient Age: 52 Room: [facility] OR ROOM 01 Gender: Female Note Status: Finalized Instrument Name: [id number] Procedure:             Upper GI endoscopy Indications:           Nausea Providers:             Midge Minium MD, MD Medicines:             Propofol per Anesthesia Complications:         No immediate complications. Procedure:             Pre-Anesthesia Assessment:                        - Prior to the procedure, a History and Physical was                         performed, and patient medications and allergies were                         reviewed. The patient's tolerance of previous                         anesthesia was also reviewed. The risks and benefits                         of the procedure and the sedation options and risks                         were discussed with the patient. All questions were                         answered, and informed consent was obtained. Prior                         Anticoagulants: The patient has taken no anticoagulant                         or antiplatelet agents. ASA Grade Assessment: II - A                         patient with mild systemic disease. After reviewing                         the risks and benefits, the patient was deemed in                         satisfactory condition to undergo the procedure.                        After obtaining informed consent, the endoscope was                         passed under direct vision. Throughout the procedure,                         the patient's blood pressure, pulse, and  oxygen                         saturations were monitored continuously. The Endoscope                         was introduced through the mouth, and advanced to the                         second part of duodenum. The upper GI  endoscopy was                         accomplished without difficulty. The patient tolerated                         the procedure well. Findings:      The Z-line was irregular and was found at the gastroesophageal junction.      The stomach was normal.      The examined duodenum was normal. Impression:            - Z-line irregular, at the gastroesophageal junction.                        - Normal stomach.                        - Normal examined duodenum.                        - No specimens collected. Recommendation:        - Discharge patient to home.                        - Resume previous diet.                        - Continue present medications.                        - Perform a colonoscopy today. Procedure Code(s):     --- Professional ---                        [phone], Esophagogastroduodenoscopy, flexible,                         transoral; diagnostic, including collection of                         specimen(s) by brushing or washing, when performed                         (separate procedure) Diagnosis Code(s):     --- Professional ---                        R11.0, Nausea CPT copyright 2022 American Medical Association. All rights reserved. The codes documented in this report are preliminary and upon coder review may  be revised to meet current compliance requirements. Midge Minium MD, MD 11/25/2022 7:40:34 AM This report has been signed electronically. Number of Addenda: 0 Note Initiated On: 11/25/2022 7:24 AM Total Procedure Duration: 0 hours 2 minutes  10 seconds  Estimated Blood Loss:  Estimated blood loss: none.      [facility]

## 2022-11-25 NOTE — Anesthesia Postprocedure Evaluation (Signed)
Anesthesia Post Note  Patient: Jennifer Wiley  Procedure(s) Performed: COLONOSCOPY WITH PROPOFOL (Rectum) ESOPHAGOGASTRODUODENOSCOPY (EGD) WITH PROPOFOL (Mouth) POLYPECTOMY (Rectum)  Patient location during evaluation: PACU Anesthesia Type: General Level of consciousness: awake and alert Pain management: pain level controlled Vital Signs Assessment: post-procedure vital signs reviewed and stable Respiratory status: spontaneous breathing, nonlabored ventilation, respiratory function stable and patient connected to nasal cannula oxygen Cardiovascular status: blood pressure returned to baseline and stable Postop Assessment: no apparent nausea or vomiting Anesthetic complications: no   No notable events documented.   Last Vitals:  Vitals:   11/25/22 0815 11/25/22 0820  BP: 126/78   Pulse: 77 82  Resp: 20 (!) 21  Temp:    SpO2: 99% 100%    Last Pain:  Vitals:   11/25/22 0815  TempSrc:   PainSc: 0-No pain                 Makalah Asberry C Rashawn Rolon

## 2022-11-25 NOTE — H&P (Signed)
Midge Minium, MD [facility] [address] [place], [place] [address] Phone:[phone] Fax : [phone]  Primary Care Physician:  Zelphia Cairo, MD Primary Gastroenterologist:  Dr. Servando Snare  Pre-Procedure History & Physical: HPI:  Jalise Williams is a 52 y.o. female is here for an endoscopy and colonoscopy.   Past Medical History:  Diagnosis Date   Anxiety    Follows w/ Dr. Renaldo Fiddler, GYN.   Borderline hypertension    Per pt on 10/06/22. Pt states her doctor thinks it is being caused by the BCP she is taking.   Family history of adverse reaction to anesthesia    Patient's mother could feel both of her C-sections to some degree.   GERD (gastroesophageal reflux disease)    History of shingles 2020   right breast and back   Uterine fibroid    Vertigo    over 15 yrs ago    Past Surgical History:  Procedure Laterality Date   BREAST BIOPSY Left    x 2, both benign   CESAREAN SECTION  07/05/2003   HYSTERECTOMY ABDOMINAL WITH SALPINGECTOMY Bilateral 10/13/2022   Procedure: ABDOMINAL HYSTERECTOMY WITH BILATERAL SALPINGECTOMY;  Surgeon: Zelphia Cairo, MD;  Location: [facility];  Service: Gynecology;  Laterality: Bilateral;    Prior to Admission medications   Medication Sig Start Date End Date Taking? Authorizing Provider  buPROPion (WELLBUTRIN SR) 150 MG 12 hr tablet Take 150 mg by mouth 2 (two) times daily.   Yes [provider]  ibuprofen (ADVIL) 600 MG tablet Take 1 tablet (600 mg total) by mouth every 6 (six) hours as needed for moderate pain. 10/14/22  Yes Zelphia Cairo, MD  pantoprazole (PROTONIX) 40 MG tablet Take 1 tablet (40 mg total) by mouth daily. 11/14/22  Yes Midge Minium, MD    Allergies as of 11/14/2022   (No Known Allergies)    Family History  Problem Relation Age of Onset   Breast cancer Mother    Cancer Mother        breast   Heart disease Father 37   Heart attack Father    Hyperlipidemia Father    Depression Brother     Lung cancer Maternal Aunt    Lung cancer Maternal Grandfather    Colon cancer Neg Hx     Social History   Socioeconomic History   Marital status: Married    Spouse name: Not on file   Number of children: 1   Years of education: Not on file   Highest education level: Not on file  Occupational History   Occupation: Physical Therapist - [employer]  Tobacco Use   Smoking status: Never   Smokeless tobacco: Never  Vaping Use   Vaping Use: Never used  Substance and Sexual Activity   Alcohol use: Yes    Alcohol/week: 7.0 standard drinks of alcohol    Types: 7 Glasses of wine per week   Drug use: No   Sexual activity: Yes    Birth control/protection: Pill  Other Topics Concern   Not on file  Social History Narrative   Not on file   Social Determinants of Health   Financial Resource Strain: Not on file  Food Insecurity: Not on file  Transportation Needs: Not on file  Physical Activity: Not on file  Stress: Not on file  Social Connections: Not on file  Intimate Partner Violence: Not on file    Review of Systems: See HPI, otherwise negative ROS  Physical Exam: BP (!) 166/110   Temp  98.1 F (36.7 C) (Temporal)   Resp 20   Ht 5' 6.5" (1.689 m)   Wt 80.3 kg   LMP 09/05/2022 Comment: irregular; DOS UPREG NEGATIVE  SpO2 98%   BMI 28.14 kg/m  General:   Alert,  pleasant and cooperative in NAD Head:  Normocephalic and atraumatic. Neck:  Supple; no masses or thyromegaly. Lungs:  Clear throughout to auscultation.    Heart:  Regular rate and rhythm. Abdomen:  Soft, nontender and nondistended. Normal bowel sounds, without guarding, and without rebound.   Neurologic:  Alert and  oriented x4;  grossly normal neurologically.  Impression/Plan: Wyndy Beilman is here for an endoscopy and colonoscopy to be performed for Nausea and screening  Risks, benefits, limitations, and alternatives regarding  endoscopy and colonoscopy have been reviewed with the patient.  Questions have been  answered.  All parties agreeable.   Midge Minium, MD  11/25/2022, 7:08 AM

## 2022-11-25 NOTE — Op Note (Signed)
[facility] Gastroenterology Patient Name: Jennifer Wiley Procedure Date: 11/25/2022 7:23 AM MRN: [id number] Account #: [id number] Date of Birth: [birth year] Admit Type: Outpatient Age: 52 Room: [facility] OR ROOM 01 Gender: Female Note Status: Finalized Instrument Name: [id number] Procedure:             Colonoscopy Indications:           Screening for colorectal malignant neoplasm Providers:             Midge Minium MD, MD Medicines:             Propofol per Anesthesia Complications:         No immediate complications. Procedure:             Pre-Anesthesia Assessment:                        - Prior to the procedure, a History and Physical was                         performed, and patient medications and allergies were                         reviewed. The patient's tolerance of previous                         anesthesia was also reviewed. The risks and benefits                         of the procedure and the sedation options and risks                         were discussed with the patient. All questions were                         answered, and informed consent was obtained. Prior                         Anticoagulants: The patient has taken no anticoagulant                         or antiplatelet agents. ASA Grade Assessment: II - A                         patient with mild systemic disease. After reviewing                         the risks and benefits, the patient was deemed in                         satisfactory condition to undergo the procedure.                        After obtaining informed consent, the colonoscope was                         passed under direct vision. Throughout the procedure,                         the patient's blood pressure,  pulse, and oxygen                         saturations were monitored continuously. The                         Colonoscope was introduced through the anus and                         advanced to the the cecum,  identified by appendiceal                         orifice and ileocecal valve. The colonoscopy was                         performed without difficulty. The patient tolerated                         the procedure well. The quality of the bowel                         preparation was excellent. Findings:      The perianal and digital rectal examinations were normal.      Two sessile polyps were found in the sigmoid colon. The polyps were 2 to       3 mm in size. These polyps were removed with a cold biopsy forceps.       Resection and retrieval were complete. Impression:            - Two 2 to 3 mm polyps in the sigmoid colon, removed                         with a cold biopsy forceps. Resected and retrieved. Recommendation:        - Discharge patient to home.                        - Resume previous diet.                        - Continue present medications.                        - Await pathology results.                        - If the pathology report reveals no adenomatous                         tissue, then repeat the colonoscopy for surveillance                         in 7 years. Procedure Code(s):     --- Professional ---                        [phone], Colonoscopy, flexible; with biopsy, single or                         multiple Diagnosis Code(s):     --- Professional ---  Z12.11, Encounter for screening for malignant neoplasm                         of colon                        D12.5, Benign neoplasm of sigmoid colon CPT copyright 2022 [employer]. All rights reserved. The codes documented in this report are preliminary and upon coder review may  be revised to meet current compliance requirements. Midge Minium MD, MD 11/25/2022 8:00:43 AM This report has been signed electronically. Number of Addenda: 0 Note Initiated On: 11/25/2022 7:23 AM Scope Withdrawal Time: 0 hours 10 minutes 2 seconds  Total Procedure Duration: 0 hours 14  minutes 48 seconds  Estimated Blood Loss:  Estimated blood loss: none.      [facility]

## 2022-11-25 NOTE — Transfer of Care (Signed)
Immediate Anesthesia Transfer of Care Note  Patient: Jennifer Wiley  Procedure(s) Performed: COLONOSCOPY WITH PROPOFOL (Rectum) ESOPHAGOGASTRODUODENOSCOPY (EGD) WITH PROPOFOL (Mouth) POLYPECTOMY (Rectum)  Patient Location: PACU  Anesthesia Type: General  Level of Consciousness: awake, alert  and patient cooperative  Airway and Oxygen Therapy: Patient Spontanous Breathing and Patient connected to supplemental oxygen  Post-op Assessment: Post-op Vital signs reviewed, Patient's Cardiovascular Status Stable, Respiratory Function Stable, Patent Airway and No signs of Nausea or vomiting  Post-op Vital Signs: Reviewed and stable  Complications: No notable events documented.

## 2022-11-29 ENCOUNTER — Encounter: Payer: Self-pay | Admitting: Gastroenterology

## 2022-12-05 ENCOUNTER — Other Ambulatory Visit: Payer: Self-pay

## 2022-12-07 ENCOUNTER — Encounter: Payer: Self-pay | Admitting: Gastroenterology

## 2023-01-03 ENCOUNTER — Ambulatory Visit: Payer: 59 | Admitting: Gastroenterology

## 2023-01-10 ENCOUNTER — Ambulatory Visit: Payer: 59 | Admitting: Internal Medicine

## 2023-12-19 ENCOUNTER — Other Ambulatory Visit: Payer: Self-pay | Admitting: Neurology

## 2023-12-19 DIAGNOSIS — M549 Dorsalgia, unspecified: Secondary | ICD-10-CM

## 2024-02-26 ENCOUNTER — Other Ambulatory Visit: Payer: Self-pay | Admitting: Obstetrics and Gynecology

## 2024-02-26 DIAGNOSIS — N644 Mastodynia: Secondary | ICD-10-CM

## 2024-02-27 ENCOUNTER — Other Ambulatory Visit

## 2024-02-27 ENCOUNTER — Other Ambulatory Visit: Payer: Self-pay | Admitting: Obstetrics and Gynecology

## 2024-02-27 ENCOUNTER — Inpatient Hospital Stay
Admission: RE | Admit: 2024-02-27 | Discharge: 2024-02-27 | Discharge status: Home / Self Care | Source: Ambulatory Visit | Attending: Obstetrics and Gynecology | Admitting: Obstetrics and Gynecology

## 2024-02-27 ENCOUNTER — Ambulatory Visit
Admission: RE | Admit: 2024-02-27 | Discharge: 2024-02-27 | Disposition: A | Discharge status: Home / Self Care | Source: Ambulatory Visit | Attending: Obstetrics and Gynecology | Admitting: Obstetrics and Gynecology

## 2024-02-27 DIAGNOSIS — N641 Fat necrosis of breast: Secondary | ICD-10-CM

## 2024-02-27 DIAGNOSIS — N644 Mastodynia: Secondary | ICD-10-CM

## 2024-02-28 ENCOUNTER — Other Ambulatory Visit: Payer: Self-pay | Admitting: Obstetrics and Gynecology

## 2024-02-28 DIAGNOSIS — N641 Fat necrosis of breast: Secondary | ICD-10-CM

## 2024-08-30 ENCOUNTER — Encounter

## 2024-08-30 ENCOUNTER — Other Ambulatory Visit
